# Patient Record
Sex: Male | Born: 1960
Health system: Southern US, Community
[De-identification: ages and names within clinical notes are randomized; demographics above are authoritative.]

## PROBLEM LIST (undated history)

## (undated) DIAGNOSIS — E669 Obesity, unspecified: Secondary | ICD-10-CM

## (undated) DIAGNOSIS — Z87442 Personal history of urinary calculi: Secondary | ICD-10-CM

## (undated) DIAGNOSIS — E785 Hyperlipidemia, unspecified: Secondary | ICD-10-CM

## (undated) DIAGNOSIS — Z8249 Family history of ischemic heart disease and other diseases of the circulatory system: Secondary | ICD-10-CM

## (undated) DIAGNOSIS — G4733 Obstructive sleep apnea (adult) (pediatric): Secondary | ICD-10-CM

## (undated) HISTORY — DX: Hyperlipidemia, unspecified: E78.5

## (undated) HISTORY — DX: Obesity, unspecified: E66.9

## (undated) HISTORY — DX: Family history of ischemic heart disease and other diseases of the circulatory system: Z82.49

## (undated) HISTORY — DX: Obstructive sleep apnea (adult) (pediatric): G47.33

---

## 2004-07-30 ENCOUNTER — Ambulatory Visit (HOSPITAL_COMMUNITY): Admission: RE | Admit: 2004-07-30 | Discharge: 2004-07-30 | Payer: Self-pay | Admitting: General Surgery

## 2005-06-16 HISTORY — PX: TRANSTHORACIC ECHOCARDIOGRAM: SHX275

## 2006-05-15 ENCOUNTER — Ambulatory Visit (HOSPITAL_COMMUNITY): Admission: RE | Admit: 2006-05-15 | Discharge: 2006-05-15 | Payer: Self-pay | Admitting: *Deleted

## 2006-05-21 ENCOUNTER — Ambulatory Visit (HOSPITAL_COMMUNITY): Admission: RE | Admit: 2006-05-21 | Discharge: 2006-05-21 | Payer: Self-pay | Admitting: Cardiology

## 2006-05-21 HISTORY — PX: CARDIAC CATHETERIZATION: SHX172

## 2006-06-06 ENCOUNTER — Ambulatory Visit (HOSPITAL_COMMUNITY): Admission: RE | Admit: 2006-06-06 | Discharge: 2006-06-06 | Payer: Self-pay | Admitting: Family Medicine

## 2010-06-16 HISTORY — PX: NM MYOCAR PERF WALL MOTION: HXRAD629

## 2013-12-19 ENCOUNTER — Encounter: Payer: Self-pay | Admitting: *Deleted

## 2013-12-28 ENCOUNTER — Ambulatory Visit (INDEPENDENT_AMBULATORY_CARE_PROVIDER_SITE_OTHER): Payer: 59 | Admitting: Internal Medicine

## 2013-12-28 ENCOUNTER — Encounter: Payer: Self-pay | Admitting: Internal Medicine

## 2013-12-28 VITALS — BP 146/82 | HR 59 | Ht 65.0 in | Wt 199.7 lb

## 2013-12-28 DIAGNOSIS — Z9989 Dependence on other enabling machines and devices: Secondary | ICD-10-CM

## 2013-12-28 DIAGNOSIS — R079 Chest pain, unspecified: Secondary | ICD-10-CM | POA: Insufficient documentation

## 2013-12-28 DIAGNOSIS — G4733 Obstructive sleep apnea (adult) (pediatric): Secondary | ICD-10-CM | POA: Insufficient documentation

## 2013-12-28 DIAGNOSIS — E785 Hyperlipidemia, unspecified: Secondary | ICD-10-CM

## 2013-12-28 NOTE — Patient Instructions (Signed)
Your physician has requested that you have an exercise tolerance test. For further information please visit [email or web address]. Please also follow instruction sheet, as given.  Your physician recommends that you return for lab work in a few days to week. (fasting)  Your physician recommends that you schedule a follow-up appointment in: 1 month.

## 2013-12-28 NOTE — Progress Notes (Signed)
OFFICE NOTE  Chief Complaint:  Chest pain  Primary Care Physician: No PCP Per Patient  HPI:  Jeffrey Holden a pleasant 53 year old male who I had previously seen in 2012 however he has been lost to followup. He presents today with symptoms of recurrent chest pain. His past medical history significant for a family history of coronary disease however he did not have any significant coronary disease. He had a stress test which was negative and a cardiac catheterization in 2007 which note showed no significant coronary disease. He also had a repeat stress test in 2012 which showed no ischemia and EF of 76%. He had good exercise capacity exercising for 11 minutes. He does have a history of sleep apnea on CPAP and is continuing to sleep well on that. He is maintaining too high of a weight and has a history of dyslipidemia but stopped taking his cholesterol medicine sometime ago and did not get his medications refilled. He is now reporting chest discomfort that comes on while he is at work. He feels a tightness in his chest especially when he gets upset or feels under stress. The symptoms improved with rest. He did not have symptoms during other activities such as exercise or physical activity.  PMHx:  Past Medical History  Diagnosis Date  . Family history of heart disease   . OSA (obstructive sleep apnea)   . Obesity   . Dyslipidemia     Past Surgical History  Procedure Laterality Date  . Transthoracic echocardiogram  2007    EF =>55%; mild MR; trace TR  . Nm myocar perf wall motion  2012    bruce myoview - normal perfusion in all regions, EF 76%  . Cardiac catheterization  05/21/2006    normal coronaries, normal LV function (Dr. Domenic Moras)    FAMHx:  Family History  Problem Relation Age of Onset  . Heart disease Mother   . Coronary artery disease Father     CABG at 43    SOCHx:   reports that he has never smoked. He has never used smokeless tobacco. He reports that he does  not drink alcohol or use illicit drugs.  ALLERGIES:  Allergies  Allergen Reactions  . Penicillins     ROS: A comprehensive review of systems was negative except for: Cardiovascular: positive for chest pain  HOME MEDS: Current Outpatient Prescriptions  Medication Sig Dispense Refill  . NON FORMULARY at bedtime. CPAP       No current facility-administered medications for this visit.    LABS/IMAGING: No results found for this or any previous visit (from the past 48 hour(s)). No results found.  VITALS: BP 146/82  Pulse 59  Ht 5\' 5"  (1.651 m)  Wt 199 lb 11.2 oz (90.583 kg)  BMI 33.23 kg/m2  EXAM: General appearance: alert and no distress Neck: no carotid bruit and no JVD Lungs: clear to auscultation bilaterally Heart: regular rate and rhythm, S1, S2 normal, no murmur, click, rub or gallop Abdomen: soft, non-tender; bowel sounds normal; no masses,  no organomegaly Extremities: extremities normal, atraumatic, no cyanosis or edema Pulses: 2+ and symmetric Skin: Skin color, texture, turgor normal. No rashes or lesions Neurologic: Grossly normal Psych: Mood, affect normal  EKG: Sinus bradycardia 59, no ischemia  ASSESSMENT: 1. Atypical chest pain 2. Dyslipidemia  PLAN: 1.   Mr. Sprong seems to be having chest pain which is related to stress and/or anxiety at work. He does not have symptoms with exercise or activity  outside of the office. He may also be due to spikes in his blood pressure. His blood pressure was high normal today and he's not on medication. We will need to see whether some blood pressure medication may be helpful for him. I would recommend a treadmill exercise stress test to reassess his functional capacity. He is interested in starting an exercise program and possibly losing weight. This would also help him with stress management. It his blood pressure is elevated or there is a significant hypertensive response to exercise, he may need a low dose of blood  pressure medication.  I will also go ahead and recheck a fasting lipid profile.  Pixie Casino, MD, [facility] Attending Cardiologist [facility]  Chelsa Stout C 12/28/2013, 2:09 PM

## 2014-01-05 ENCOUNTER — Encounter (HOSPITAL_COMMUNITY): Payer: 59

## 2014-01-06 ENCOUNTER — Encounter (HOSPITAL_COMMUNITY): Payer: 59

## 2014-01-10 ENCOUNTER — Telehealth (HOSPITAL_COMMUNITY): Payer: Self-pay

## 2014-01-10 NOTE — Telephone Encounter (Signed)
Encounter complete. 

## 2014-01-11 ENCOUNTER — Ambulatory Visit: Payer: Self-pay | Admitting: Internal Medicine

## 2014-01-12 ENCOUNTER — Ambulatory Visit (HOSPITAL_COMMUNITY)
Admission: RE | Admit: 2014-01-12 | Discharge: 2014-01-12 | Disposition: A | Discharge status: Home / Self Care | Payer: 59 | Source: Ambulatory Visit | Attending: Cardiology | Admitting: Cardiology

## 2014-01-12 DIAGNOSIS — R079 Chest pain, unspecified: Secondary | ICD-10-CM | POA: Diagnosis not present

## 2014-01-12 DIAGNOSIS — Z0189 Encounter for other specified special examinations: Secondary | ICD-10-CM | POA: Insufficient documentation

## 2014-01-12 LAB — LIPID PANEL
CHOL/HDL RATIO: 6.5 ratio
CHOLESTEROL: 202 mg/dL — AB (ref 0–200)
HDL: 31 mg/dL — AB (ref >39)
LDL Cholesterol: 142 mg/dL — ABNORMAL HIGH (ref 0–99)
Triglycerides: 147 mg/dL (ref <150)
VLDL: 29 mg/dL (ref 0–40)

## 2014-01-12 NOTE — Procedures (Signed)
Exercise Treadmill Test  Pre-Exercise Testing Evaluation   Test  Exercise Tolerance Test Ordering MD: Pixie Casino, MD  Interpreting MD:   Unique Test No: 1  Treadmill:  1  Indication for ETT: chest pain - rule out ischemia  Contraindication to ETT: Yes   Stress Modality: exercise - treadmill  Cardiac Imaging Performed: non   Protocol: standard Bruce - maximal  Max BP:  225/94  Max MPHR (bpm): 166 85% MPR (bpm):  141  MPHR obtained (bpm):  164 % MPHR obtained:  98  Reached 85% MPHR (min:sec): 9:42 Total Exercise Time (min-sec):  11:37  Workload in METS:  13.70 Borg Scale: Not recorded  Reason ETT Terminated:  fatigue    ST Segment Analysis At Rest: normal ST segments - no evidence of significant ST depression With Exercise: no evidence of significant ST depression  Other Information Arrhythmia:  No Angina during ETT:  absent (0) Quality of ETT:  diagnostic  ETT Interpretation:  normal - no evidence of ischemia by ST analysis  Comments: The patient had an good tolerance.  There was no chest pain.  There was an appropriate level of dyspnea.  There were no arrhythmias, a normal heart rate response and slightly increased BP response.  There were no ischemic ST T wave changes and a normal heart rate recovery.  The Duke score was 6 with a low risk category   Recommendations: Negative adequate ETT.  There was a bit of a hypertensive BP response with a reading of 218/108 at peak exercise.

## 2014-02-13 ENCOUNTER — Encounter: Payer: Self-pay | Admitting: Internal Medicine

## 2014-02-13 ENCOUNTER — Ambulatory Visit (INDEPENDENT_AMBULATORY_CARE_PROVIDER_SITE_OTHER): Payer: 59 | Admitting: Internal Medicine

## 2014-02-13 VITALS — BP 143/94 | HR 58 | Ht 65.0 in | Wt 202.5 lb

## 2014-02-13 DIAGNOSIS — I10 Essential (primary) hypertension: Secondary | ICD-10-CM | POA: Insufficient documentation

## 2014-02-13 DIAGNOSIS — R072 Precordial pain: Secondary | ICD-10-CM

## 2014-02-13 DIAGNOSIS — E785 Hyperlipidemia, unspecified: Secondary | ICD-10-CM

## 2014-02-13 DIAGNOSIS — Z9989 Dependence on other enabling machines and devices: Secondary | ICD-10-CM

## 2014-02-13 DIAGNOSIS — G4733 Obstructive sleep apnea (adult) (pediatric): Secondary | ICD-10-CM

## 2014-02-13 MED ORDER — VALSARTAN-HYDROCHLOROTHIAZIDE 80-12.5 MG PO TABS: 1.0000 | ORAL_TABLET | Freq: Every day | ORAL | Status: DC

## 2014-02-13 NOTE — Progress Notes (Signed)
OFFICE NOTE  Chief Complaint:  Chest pain  Primary Care Physician: No PCP Per Patient  HPI:  Jeffrey Holden a pleasant 53 year old male who I had previously seen in 2012 however he has been lost to followup. He presents today with symptoms of recurrent chest pain. His past medical history significant for a family history of coronary disease however he did not have any significant coronary disease. He had a stress test which was negative and a cardiac catheterization in 2007 which note showed no significant coronary disease. He also had a repeat stress test in 2012 which showed no ischemia and EF of 76%. He had good exercise capacity exercising for 11 minutes. He does have a history of sleep apnea on CPAP and is continuing to sleep well on that. He is maintaining too high of a weight and has a history of dyslipidemia but stopped taking his cholesterol medicine sometime ago and did not get his medications refilled. He is now reporting chest discomfort that comes on while he is at work. He feels a tightness in his chest especially when he gets upset or feels under stress. The symptoms improved with rest. He did not have symptoms during other activities such as exercise or physical activity.  Jeffrey Holden returns today for followup. In 1 exercise treadmill stress test for which she performed well. There was no evidence for ischemia. There was a hypertensive response to exercise. Blood pressure was well over 200/100. He's had no further chest pain symptoms.  PMHx:  Past Medical History  Diagnosis Date  . Family history of heart disease   . OSA (obstructive sleep apnea)   . Obesity   . Dyslipidemia     Past Surgical History  Procedure Laterality Date  . Transthoracic echocardiogram  2007    EF =>55%; mild MR; trace TR  . Nm myocar perf wall motion  2012    bruce myoview - normal perfusion in all regions, EF 76%  . Cardiac catheterization  05/21/2006    normal coronaries, normal LV  function (Dr. Domenic Moras)    FAMHx:  Family History  Problem Relation Age of Onset  . Heart disease Mother   . Coronary artery disease Father     CABG at 6    SOCHx:   reports that he has never smoked. He has never used smokeless tobacco. He reports that he does not drink alcohol or use illicit drugs.  ALLERGIES:  Allergies  Allergen Reactions  . Penicillins     ROS: A comprehensive review of systems was negative.  HOME MEDS: Current Outpatient Prescriptions  Medication Sig Dispense Refill  . NON FORMULARY at bedtime. CPAP      . valsartan-hydrochlorothiazide (DIOVAN-HCT) 80-12.5 MG per tablet Take 1 tablet by mouth daily.  30 tablet  6   No current facility-administered medications for this visit.    LABS/IMAGING: No results found for this or any previous visit (from the past 48 hour(s)). No results found.  VITALS: BP 143/94  Pulse 58  Ht 5\' 5"  (1.651 m)  Wt 202 lb 8 oz (91.853 kg)  BMI 33.70 kg/m2  EXAM: General appearance: alert and no distress Neck: no carotid bruit and no JVD Lungs: clear to auscultation bilaterally Heart: regular rate and rhythm, S1, S2 normal, no murmur, click, rub or gallop Abdomen: soft, non-tender; bowel sounds normal; no masses,  no organomegaly Extremities: extremities normal, atraumatic, no cyanosis or edema Pulses: 2+ and symmetric Skin: Skin color, texture, turgor normal. No  rashes or lesions Neurologic: Grossly normal Psych: Mood, affect normal  EKG: deferred  ASSESSMENT: 1. Atypical chest pain - negative exercise treadmill stress test 2. Dyslipidemia 3. Hypertension  PLAN: 1.   Jeffrey Holden had a negative exercise treadmill stress test. I think this is a good start for his exercise program. Encouraged increased aerobic exercise of at least 30-60 minutes 3-5 times a week. This should help with weight loss and improve his cholesterol profile. His cholesterol is abnormal indicating a total cholesterol of 202,  triglycerides 147, HDL 31 and LDL 142. I think he can make some strides with diet and exercise. I've given them materials today about a Mediterranean diet and ways to decrease cholesterol. Finally, he has had continued borderline elevated blood pressures. His hypertensive response to exercise indicates a proclivity to hypertension. I would therefore recommend starting medication to lower his blood pressure. I would recommend a combination ARB diuretic, specifically Diovan HCTZ 80/12.5 mg daily. He will followup with Jeffrey Holden, our hypertension specialist in one to 2 weeks for a blood pressure check to see if he is at goal. I recommended him purchasing a blood pressure cuff and keep a record of his blood pressures at home to bring with him. Plan to see him back in 6 months.  Pixie Casino, MD, [facility] Attending Cardiologist [facility]  Jeffrey Holden 02/13/2014, 10:24 AM

## 2014-02-13 NOTE — Patient Instructions (Signed)
Your physician has recommended you make the following change in your medication: START diovan-hct 80-12.5mg  once daily for blood pressure.   Please schedule a BP check appointment in 2 weeks with [place] physician wants you to follow-up in: 6 months. You will receive a reminder letter in the mail two months in advance. If you don't receive a letter, please call our office to schedule the follow-up appointment.

## 2014-03-01 ENCOUNTER — Ambulatory Visit: Payer: 59 | Admitting: Pharmacist Clinician (PhC)/ Clinical Pharmacy Specialist

## 2014-03-15 ENCOUNTER — Ambulatory Visit: Payer: 59 | Admitting: Pharmacist Clinician (PhC)/ Clinical Pharmacy Specialist

## 2014-03-17 ENCOUNTER — Ambulatory Visit (INDEPENDENT_AMBULATORY_CARE_PROVIDER_SITE_OTHER): Payer: 59 | Admitting: Pharmacist Clinician (PhC)/ Clinical Pharmacy Specialist

## 2014-03-17 VITALS — BP 110/76 | HR 64 | Ht 65.0 in | Wt 200.9 lb

## 2014-03-17 DIAGNOSIS — I10 Essential (primary) hypertension: Secondary | ICD-10-CM

## 2014-03-17 LAB — BASIC METABOLIC PANEL
BUN: 27 mg/dL — AB (ref 6–23)
CALCIUM: 9.5 mg/dL (ref 8.4–10.5)
CO2: 23 meq/L (ref 19–32)
Chloride: 104 mEq/L (ref 96–112)
Creat: 1 mg/dL (ref 0.50–1.35)
GLUCOSE: 119 mg/dL — AB (ref 70–99)
POTASSIUM: 4.3 meq/L (ref 3.5–5.3)
Sodium: 138 mEq/L (ref 135–145)

## 2014-03-17 NOTE — Patient Instructions (Signed)
Return for a a follow up appointment with Dr. Debara Pickett in 6 months  Your blood pressure today is 110/76  Take your BP meds as follows: - continue with the valsartan/hct 80/12.5  Bring all of your meds, your BP cuff and your record of home blood pressures to your next appointment.  Exercise as you're able, try to walk approximately 30 minutes per day.  Keep salt intake to a minimum, especially watch canned and prepared boxed foods.  Eat more fresh fruits and vegetables and fewer canned items.  Avoid eating in fast food restaurants.   Should you decide to buy a blood pressure cuff, OMRON is a good brand.  Bring it by the office for calibration within 1-2 weeks of purchase   HOW TO TAKE YOUR BLOOD PRESSURE:   Rest 5 minutes before taking your blood pressure.    Don't smoke or drink caffeinated beverages for at least 30 minutes before.   Take your blood pressure before (not after) you eat.   Sit comfortably with your back supported and both feet on the floor (don't cross your legs).   Elevate your arm to heart level on a table or a desk.   Use the proper sized cuff. It should fit smoothly and snugly around your bare upper arm. There should be enough room to slip a fingertip under the cuff. The bottom edge of the cuff should be 1 inch above the crease of the elbow.   Ideally, take 3 measurements at one sitting and record the average.

## 2014-03-19 ENCOUNTER — Encounter: Payer: Self-pay | Admitting: Pharmacist Clinician (PhC)/ Clinical Pharmacy Specialist

## 2014-03-19 NOTE — Progress Notes (Signed)
     03/19/2014 Jeffrey Holden [birth year] [id number]   HPI:  Jeffrey Holden is a 53 y.o. male patient of Dr Debara Pickett, with a PMH below who presents today for hypertension clinic evaluation.  Jeffrey Holden has a significant family history in that his father had CABG x 2 at the age of 28.   He recently saw Dr. Debara Pickett for chest discomfort, an exercise stress test was negative, although it did show a hypertensive response to 218/108.  Dr Debara Pickett started him on valsartan hctz 80/12.5 and he is in the office today for a follow up.    Socially, he does not drink alcohol or use tobacco products.  He does not do any regular exercise and works a Network engineer job, getting very little daily movement.  Caffeine consumption is limited to a morning cup of coffee.  He admits to eating out about 1/2 of his meals, but those he eats at home do not involve added salt.   He does suffer from sleep apnea and uses CPAP regularly.  He also states compliance with taking his valsartan/hctz, his only medication.   He previously took simvastatin 20mg  for his cholesterol, but states he quit taking that awhile back.    Current Outpatient Prescriptions  Medication Sig Dispense Refill  . NON FORMULARY at bedtime. CPAP      . valsartan-hydrochlorothiazide (DIOVAN-HCT) 80-12.5 MG per tablet Take 1 tablet by mouth daily.  30 tablet  6   No current facility-administered medications for this visit.    Allergies  Allergen Reactions  . Penicillins     Past Medical History  Diagnosis Date  . Family history of heart disease   . OSA (obstructive sleep apnea)   . Obesity   . Dyslipidemia     Blood pressure 110/76, pulse 64, height 5\' 5"  (1.651 m), weight 200 lb 14.4 oz (91.128 kg).   ASSESSMENT AND PLAN:  Today Jeffrey Holden blood pressure appears well controlled.  I have encouraged him to start exercising, starting with short walks during his lunch break or after work.  We talked about the benefits of decreased sodium (and  looking for restaurant choices that low sodium) and increased exercise in maintaining good cardiac health and blood pressure.   I suggested that he did not need to purchase a blood pressure cuff at this time, but that he should check it on occasion when in a drugstore.  I will have him go to the lab for a BMET, to be sure his kidney function and potassium levels are WNL, otherwise I will see him back in the future should he or another provider notice a rise in his BP.  Jeffrey Holden PharmD CPP [facility]

## 2014-12-05 ENCOUNTER — Other Ambulatory Visit: Payer: Self-pay | Admitting: Internal Medicine

## 2014-12-05 NOTE — Telephone Encounter (Signed)
Rx has been sent to the pharmacy electronically. ° °

## 2015-01-04 ENCOUNTER — Other Ambulatory Visit: Payer: Self-pay | Admitting: Internal Medicine

## 2015-01-04 NOTE — Telephone Encounter (Signed)
Rx(s) sent to pharmacy electronically.  

## 2015-01-12 ENCOUNTER — Other Ambulatory Visit: Payer: Self-pay | Admitting: Internal Medicine

## 2015-01-12 ENCOUNTER — Other Ambulatory Visit: Payer: Self-pay | Admitting: *Deleted

## 2015-01-12 NOTE — Telephone Encounter (Signed)
REFILL 

## 2015-02-15 ENCOUNTER — Encounter: Payer: Self-pay | Admitting: Physician Assistant

## 2015-02-15 ENCOUNTER — Other Ambulatory Visit: Payer: Self-pay

## 2015-02-15 ENCOUNTER — Ambulatory Visit (INDEPENDENT_AMBULATORY_CARE_PROVIDER_SITE_OTHER): Payer: 59 | Admitting: Physician Assistant

## 2015-02-15 VITALS — BP 124/90 | HR 56 | Ht 65.0 in | Wt 201.4 lb

## 2015-02-15 DIAGNOSIS — E785 Hyperlipidemia, unspecified: Secondary | ICD-10-CM | POA: Diagnosis not present

## 2015-02-15 DIAGNOSIS — G4733 Obstructive sleep apnea (adult) (pediatric): Secondary | ICD-10-CM

## 2015-02-15 DIAGNOSIS — I10 Essential (primary) hypertension: Secondary | ICD-10-CM | POA: Diagnosis not present

## 2015-02-15 DIAGNOSIS — Z9989 Dependence on other enabling machines and devices: Principal | ICD-10-CM

## 2015-02-15 MED ORDER — VALSARTAN-HYDROCHLOROTHIAZIDE 80-12.5 MG PO TABS: 1.0000 | ORAL_TABLET | Freq: Every day | ORAL | Status: DC

## 2015-02-15 NOTE — Patient Instructions (Signed)
Your physician wants you to follow-up in: 1 year with Dr. Hilty. You will receive a reminder letter in the mail two months in advance. If you don't receive a letter, please call our office to schedule the follow-up appointment.  

## 2015-02-15 NOTE — Progress Notes (Signed)
Patient ID: Jeffrey Holden, male   DOB: 1960/10/23, 54 y.o.   MRN: [id number]     Date:  02/15/2015   ID:  Jeffrey Holden, DOB 10-Jan-1961, MRN [id number]  PCP:  No PCP Per Patient  Primary Cardiologist:  Hilty   Chief Complaint  Patient presents with  . Follow-up    need refills     History of Present Illness: [name] is a 54 y.o. male past medical history significant for a family history of coronary disease however, he did not have any significant coronary disease. He had a stress test which was negative and a cardiac catheterization in 2007 which note showed no significant coronary disease. He also had a repeat stress test in 2012 which showed no ischemia and EF of 76%. He had good exercise capacity exercising for 11 minutes. In 2015 he had another exercise tolerance test which was negative for ischemia. He does have a history of sleep apnea on CPAP and is continuing to sleep well on that. He is maintaining too high of a weight and has a history of dyslipidemia but stopped taking his cholesterol medicine sometime ago and did not get his medications refilled.  She presents today for medication refill and follow-up.  He has no complaints.  He reports BP seems well controlled every time it is checked. He does report some periodic blood in his stool which he attributes to hemorrhoids.  The patient currently denies nausea, vomiting, fever, chest pain, shortness of breath, orthopnea, dizziness, PND, cough, congestion, abdominal pain, melena, lower extremity edema, claudication.  Wt Readings from Last 3 Encounters:  02/15/15 201 lb 6.4 oz (91.354 kg)  03/19/14 200 lb 14.4 oz (91.128 kg)  02/13/14 202 lb 8 oz (91.853 kg)     Past Medical History  Diagnosis Date  . Family history of heart disease   . OSA (obstructive sleep apnea)   . Obesity   . Dyslipidemia     Current Outpatient Prescriptions  Medication Sig Dispense Refill  . NON FORMULARY at bedtime. CPAP    .  valsartan-hydrochlorothiazide (DIOVAN-HCT) 80-12.5 MG per tablet Take 1 tablet by mouth daily. NEED OV. 30 tablet 0   No current facility-administered medications for this visit.    Allergies:    Allergies  Allergen Reactions  . Penicillins     Social History:  The patient  reports that he has never smoked. He has never used smokeless tobacco. He reports that he does not drink alcohol or use illicit drugs.   Family history:   Family History  Problem Relation Age of Onset  . Heart disease Mother   . Coronary artery disease Father     CABG at 51    ROS:  Please see the history of present illness.  All other systems reviewed and negative.   PHYSICAL EXAM: VS:  BP 124/90 mmHg  Pulse 56  Ht 5\' 5"  (1.651 m)  Wt 201 lb 6.4 oz (91.354 kg)  BMI 33.51 kg/m2 Well nourished, well developed, in no acute distress HEENT: Pupils are equal round react to light accommodation extraocular movements are intact.  Neck: no JVDNo cervical lymphadenopathy. Cardiac: Regular rate and rhythm without murmurs rubs or gallops. Lungs:  clear to auscultation bilaterally, no wheezing, rhonchi or rales Ext: no lower extremity edema.  2+ radial and dorsalis pedis pulses. Skin: warm and dry Neuro:  Grossly normal  EKG:  His bradycardia rate 56 bpm  ASSESSMENT AND PLAN:  Problem List Items Addressed This Visit  OSA on CPAP - Primary   Relevant Orders   EKG 12-Lead   Lipid panel   Dyslipidemia   Relevant Orders   EKG 12-Lead   Lipid panel   Benign essential HTN     Objective sleep apnea on CPAP  Essential hypertension  Blood pressure well-controlled. Continue Diovan HCT. Dyslipidemia Previous lipid panel: total cholesterol of 202, triglycerides 147, HDL 31 and LDL 142.  We'll have him recheck this in the next week or 2.   Follow-up in one year

## 2015-02-17 ENCOUNTER — Other Ambulatory Visit: Payer: Self-pay | Admitting: Internal Medicine

## 2015-03-27 LAB — LIPID PANEL
CHOL/HDL RATIO: 7 ratio — AB (ref <=5.0)
Cholesterol: 204 mg/dL — ABNORMAL HIGH (ref 125–200)
HDL: 29 mg/dL — ABNORMAL LOW (ref >=40)
LDL Cholesterol: 140 mg/dL — ABNORMAL HIGH (ref <130)
Triglycerides: 174 mg/dL — ABNORMAL HIGH (ref <150)
VLDL: 35 mg/dL — ABNORMAL HIGH (ref <30)

## 2015-04-03 ENCOUNTER — Other Ambulatory Visit: Payer: Self-pay

## 2015-04-03 DIAGNOSIS — E785 Hyperlipidemia, unspecified: Secondary | ICD-10-CM

## 2015-04-03 DIAGNOSIS — I10 Essential (primary) hypertension: Secondary | ICD-10-CM

## 2015-04-03 MED ORDER — ATORVASTATIN CALCIUM 20 MG PO TABS: 20.0000 mg | ORAL_TABLET | Freq: Every day | ORAL | Status: DC

## 2016-03-31 ENCOUNTER — Other Ambulatory Visit: Payer: Self-pay

## 2016-03-31 MED ORDER — ATORVASTATIN CALCIUM 20 MG PO TABS: 20.0000 mg | ORAL_TABLET | Freq: Every day | ORAL | 6 refills | Status: DC

## 2016-04-08 ENCOUNTER — Other Ambulatory Visit (HOSPITAL_COMMUNITY): Payer: Self-pay | Admitting: Registered Nurse

## 2016-04-08 DIAGNOSIS — R1902 Left upper quadrant abdominal swelling, mass and lump: Secondary | ICD-10-CM

## 2016-04-14 ENCOUNTER — Ambulatory Visit (HOSPITAL_COMMUNITY)
Admission: RE | Admit: 2016-04-14 | Discharge: 2016-04-14 | Disposition: A | Discharge status: Home / Self Care | Payer: 59 | Source: Ambulatory Visit | Attending: Registered Nurse | Admitting: Registered Nurse

## 2016-04-14 DIAGNOSIS — K760 Fatty (change of) liver, not elsewhere classified: Secondary | ICD-10-CM | POA: Diagnosis not present

## 2016-04-14 DIAGNOSIS — R911 Solitary pulmonary nodule: Secondary | ICD-10-CM | POA: Insufficient documentation

## 2016-04-14 DIAGNOSIS — R1902 Left upper quadrant abdominal swelling, mass and lump: Secondary | ICD-10-CM

## 2016-04-14 MED ORDER — IOPAMIDOL (ISOVUE-300) INJECTION 61%
100.0000 mL | Freq: Once | INTRAVENOUS | Status: AC | PRN
  Administered 2016-04-14: 100 mL via INTRAVENOUS

## 2016-04-16 ENCOUNTER — Other Ambulatory Visit (HOSPITAL_COMMUNITY): Payer: Self-pay | Admitting: Family Medicine

## 2016-04-16 DIAGNOSIS — R918 Other nonspecific abnormal finding of lung field: Secondary | ICD-10-CM

## 2016-04-25 ENCOUNTER — Ambulatory Visit (HOSPITAL_COMMUNITY)
Admission: RE | Admit: 2016-04-25 | Discharge: 2016-04-25 | Disposition: A | Discharge status: Home / Self Care | Payer: 59 | Source: Ambulatory Visit | Attending: Family Medicine | Admitting: Family Medicine

## 2016-04-25 DIAGNOSIS — R918 Other nonspecific abnormal finding of lung field: Secondary | ICD-10-CM | POA: Insufficient documentation

## 2016-04-25 DIAGNOSIS — J984 Other disorders of lung: Secondary | ICD-10-CM | POA: Diagnosis present

## 2016-07-07 DIAGNOSIS — J018 Other acute sinusitis: Secondary | ICD-10-CM | POA: Diagnosis not present

## 2016-07-20 ENCOUNTER — Emergency Department (HOSPITAL_COMMUNITY)
Admission: EM | Admit: 2016-07-20 | Discharge: 2016-07-20 | Disposition: A | Discharge status: Home / Self Care | Payer: 59 | Attending: Emergency Medicine | Admitting: Emergency Medicine

## 2016-07-20 ENCOUNTER — Encounter (HOSPITAL_COMMUNITY): Payer: Self-pay | Admitting: Emergency Medicine

## 2016-07-20 ENCOUNTER — Emergency Department (HOSPITAL_COMMUNITY): Payer: 59

## 2016-07-20 DIAGNOSIS — Z79899 Other long term (current) drug therapy: Secondary | ICD-10-CM | POA: Insufficient documentation

## 2016-07-20 DIAGNOSIS — L03115 Cellulitis of right lower limb: Secondary | ICD-10-CM | POA: Diagnosis not present

## 2016-07-20 DIAGNOSIS — R600 Localized edema: Secondary | ICD-10-CM | POA: Diagnosis not present

## 2016-07-20 DIAGNOSIS — M79604 Pain in right leg: Secondary | ICD-10-CM | POA: Diagnosis not present

## 2016-07-20 MED ORDER — HYDROCODONE-ACETAMINOPHEN 5-325 MG PO TABS: 2.0000 | ORAL_TABLET | ORAL | 0 refills | Status: DC | PRN

## 2016-07-20 MED ORDER — CEPHALEXIN 500 MG PO CAPS: 500.0000 mg | ORAL_CAPSULE | Freq: Four times a day (QID) | ORAL | 0 refills | Status: DC

## 2016-07-20 MED ORDER — SULFAMETHOXAZOLE-TRIMETHOPRIM 800-160 MG PO TABS: 1.0000 | ORAL_TABLET | Freq: Two times a day (BID) | ORAL | 0 refills | Status: AC

## 2016-07-20 NOTE — ED Triage Notes (Signed)
Patient c/o right inner thigh pain that started yesterday. Per patient "knot" in inner thigh with burning sensation with walking. Denies any injury. Per patient recently on 3 hour flight and pain started upon going back to work. Per patient no change in temp of right lower extremity. Denies any swelling in lower extremity other than site of "knot."

## 2016-07-20 NOTE — ED Notes (Signed)
To radiology via stretcher.

## 2016-07-20 NOTE — ED Notes (Signed)
From radiology 

## 2016-07-20 NOTE — Discharge Instructions (Signed)
Return if any problems.

## 2016-07-20 NOTE — ED Provider Notes (Signed)
[facility] DEPT Provider Note   CSN: [id number] Arrival date & time: 07/20/16  0900     History   Chief Complaint Chief Complaint  Patient presents with  . Leg Pain    HPI Jeffrey Holden is a 56 y.o. male.  The history is provided by the patient. No language interpreter was used.  Leg Pain   This is a new problem. The current episode started yesterday. The pain is present in the right upper leg. The quality of the pain is described as aching. The pain is at a severity of 5/10. The pain is moderate. The symptoms are aggravated by standing. He has tried nothing for the symptoms. The treatment provided no relief. There has been no history of extremity trauma.   Pt complains of redness and swelling to right leg.  Pt reports he feels a knot in his leg. Past Medical History:  Diagnosis Date  . Dyslipidemia   . Family history of heart disease   . Obesity   . OSA (obstructive sleep apnea)     Patient Active Problem List   Diagnosis Date Noted  . Benign essential HTN 02/13/2014  . OSA on CPAP 12/28/2013  . Dyslipidemia 12/28/2013  . Chest pain 12/28/2013    Past Surgical History:  Procedure Laterality Date  . CARDIAC CATHETERIZATION  05/21/2006   normal coronaries, normal LV function (Dr. Domenic Moras)  . NM MYOCAR PERF WALL MOTION  2012   bruce myoview - normal perfusion in all regions, EF 76%  . TRANSTHORACIC ECHOCARDIOGRAM  2007   EF =>55%; mild MR; trace TR       Home Medications    Prior to Admission medications   Medication Sig Start Date End Date Taking? Authorizing Provider  valsartan-hydrochlorothiazide (DIOVAN-HCT) 80-12.5 MG per tablet Take 1 tablet by mouth daily. 02/15/15  Yes Brett Canales, PA-C    Family History Family History  Problem Relation Age of Onset  . Heart disease Mother   . Coronary artery disease Father     CABG at 9    Social History Social History  Substance Use Topics  . Smoking status: Never Smoker  . Smokeless tobacco:  Never Used  . Alcohol use No     Allergies   Penicillins   Review of Systems Review of Systems   Physical Exam Updated Vital Signs BP 137/91 (BP Location: Left Arm)   Pulse 60   Temp 98.4 F (36.9 C) (Oral)   Resp 16   Ht 5\' 5"  (1.651 m)   Wt 90.7 kg   SpO2 98%   BMI 33.28 kg/m   Physical Exam  Constitutional: He appears well-developed and well-nourished.  HENT:  Head: Normocephalic and atraumatic.  Eyes: Conjunctivae are normal.  Neck: Neck supple.  Cardiovascular: Normal rate and regular rhythm.   No murmur heard. Pulmonary/Chest: Effort normal and breath sounds normal. No respiratory distress.  Abdominal: Soft. There is no tenderness.  Musculoskeletal: He exhibits no edema.  Tender right medial thigh, 15x10 cm area of erythema, firm area center   Neurological: He is alert.  Skin: Skin is warm and dry.  Psychiatric: He has a normal mood and affect.  Nursing note and vitals reviewed.    ED Treatments / Results  Labs (all labs ordered are listed, but only abnormal results are displayed) Labs Reviewed - No data to display  EKG  EKG Interpretation None       Radiology US Venous Img Lower Unilateral Right  Result Date: 07/20/2016  CLINICAL DATA:  Right lower extremity pain and edema for the past day. Palpable area involving the inner aspect the right thigh. Evaluate for DVT. EXAM: RIGHT LOWER EXTREMITY VENOUS DOPPLER ULTRASOUND TECHNIQUE: Gray-scale sonography with graded compression, as well as color Doppler and duplex ultrasound were performed to evaluate the lower extremity deep venous systems from the level of the common femoral vein and including the common femoral, femoral, profunda femoral, popliteal and calf veins including the posterior tibial, peroneal and gastrocnemius veins when visible. The superficial great saphenous vein was also interrogated. Spectral Doppler was utilized to evaluate flow at rest and with distal augmentation maneuvers in the  common femoral, femoral and popliteal veins. COMPARISON:  None. FINDINGS: Contralateral Common Femoral Vein: Respiratory phasicity is normal and symmetric with the symptomatic side. No evidence of thrombus. Normal compressibility. Common Femoral Vein: No evidence of thrombus. Normal compressibility, respiratory phasicity and response to augmentation. Saphenofemoral Junction: No evidence of thrombus. Normal compressibility and flow on color Doppler imaging. Profunda Femoral Vein: No evidence of thrombus. Normal compressibility and flow on color Doppler imaging. Femoral Vein: No evidence of thrombus. Normal compressibility, respiratory phasicity and response to augmentation. Popliteal Vein: No evidence of thrombus. Normal compressibility, respiratory phasicity and response to augmentation. Calf Veins: No evidence of thrombus. Normal compressibility and flow on color Doppler imaging. Superficial Great Saphenous Vein: No evidence of thrombus. Normal compressibility and flow on color Doppler imaging. Venous Reflux:  None. Other Findings: Provided grayscale and color images of the patient's palpable area of concern involving the medial aspect the right thigh are negative for discrete solid or cystic lesions (representative images 23 through 27). IMPRESSION: 1. No evidence of DVT within the right lower extremity. 2. No sonographic correlate for patient's palpable area of concern involving the inner aspect of the right thigh. Electronically Signed   By: Sandi Mariscal M.D.   On: 07/20/2016 11:53    Procedures Procedures (including critical care time)  Medications Ordered in ED Medications - No data to display   Initial Impression / Assessment and Plan / ED Course  I have reviewed the triage vital signs and the nursing notes.  Pertinent labs & imaging results that were available during my care of the patient were reviewed by me and considered in my medical decision making (see chart for details).    I will treat  pt with keflex and bactrim.  Pt given rx for hydrocodone for pain   Final Clinical Impressions(s) / ED Diagnoses   Final diagnoses:  Cellulitis of right lower extremity    New Prescriptions Discharge Medication List as of 07/20/2016 12:37 PM    START taking these medications   Details  cephALEXin (KEFLEX) 500 MG capsule Take 1 capsule (500 mg total) by mouth 4 (four) times daily., Starting Sun 07/20/2016, Print    HYDROcodone-acetaminophen (NORCO/VICODIN) 5-325 MG tablet Take 2 tablets by mouth every 4 (four) hours as needed., Starting Sun 07/20/2016, Print    sulfamethoxazole-trimethoprim (BACTRIM DS,SEPTRA DS) 800-160 MG tablet Take 1 tablet by mouth 2 (two) times daily., Starting Sun 07/20/2016, Until Sun 07/27/2016, Print      An After Visit Summary was printed and given to the patient.   Hollace Kinnier Dunn Loring, PA-C 07/20/16 [place], MD 07/22/16 [phone]

## 2016-11-28 DIAGNOSIS — I10 Essential (primary) hypertension: Secondary | ICD-10-CM | POA: Diagnosis not present

## 2016-11-28 DIAGNOSIS — Z1389 Encounter for screening for other disorder: Secondary | ICD-10-CM | POA: Diagnosis not present

## 2016-11-28 DIAGNOSIS — E782 Mixed hyperlipidemia: Secondary | ICD-10-CM | POA: Diagnosis not present

## 2016-11-28 DIAGNOSIS — R7309 Other abnormal glucose: Secondary | ICD-10-CM | POA: Diagnosis not present

## 2016-11-28 DIAGNOSIS — J984 Other disorders of lung: Secondary | ICD-10-CM | POA: Diagnosis not present

## 2016-12-02 ENCOUNTER — Other Ambulatory Visit (HOSPITAL_COMMUNITY): Payer: Self-pay | Admitting: Family Medicine

## 2016-12-02 DIAGNOSIS — J984 Other disorders of lung: Secondary | ICD-10-CM

## 2016-12-11 ENCOUNTER — Ambulatory Visit (HOSPITAL_COMMUNITY)
Admission: RE | Admit: 2016-12-11 | Discharge: 2016-12-11 | Disposition: A | Discharge status: Home / Self Care | Payer: 59 | Source: Ambulatory Visit | Attending: Family Medicine | Admitting: Family Medicine

## 2016-12-11 DIAGNOSIS — I700 Atherosclerosis of aorta: Secondary | ICD-10-CM | POA: Insufficient documentation

## 2016-12-11 DIAGNOSIS — R918 Other nonspecific abnormal finding of lung field: Secondary | ICD-10-CM | POA: Insufficient documentation

## 2016-12-11 DIAGNOSIS — J984 Other disorders of lung: Secondary | ICD-10-CM | POA: Diagnosis not present

## 2016-12-28 ENCOUNTER — Other Ambulatory Visit: Payer: Self-pay | Admitting: Internal Medicine

## 2017-07-27 DIAGNOSIS — K7689 Other specified diseases of liver: Secondary | ICD-10-CM | POA: Diagnosis not present

## 2017-08-12 DIAGNOSIS — R7309 Other abnormal glucose: Secondary | ICD-10-CM | POA: Diagnosis not present

## 2017-08-12 DIAGNOSIS — Z1389 Encounter for screening for other disorder: Secondary | ICD-10-CM | POA: Diagnosis not present

## 2017-08-12 DIAGNOSIS — E782 Mixed hyperlipidemia: Secondary | ICD-10-CM | POA: Diagnosis not present

## 2017-08-12 DIAGNOSIS — Z23 Encounter for immunization: Secondary | ICD-10-CM | POA: Diagnosis not present

## 2017-08-25 ENCOUNTER — Encounter: Payer: Self-pay | Admitting: Gastroenterology

## 2017-09-14 ENCOUNTER — Ambulatory Visit (INDEPENDENT_AMBULATORY_CARE_PROVIDER_SITE_OTHER): Payer: 59

## 2017-09-14 DIAGNOSIS — Z1211 Encounter for screening for malignant neoplasm of colon: Secondary | ICD-10-CM

## 2017-09-14 MED ORDER — NA SULFATE-K SULFATE-MG SULF 17.5-3.13-1.6 GM/177ML PO SOLN: 1.0000 | ORAL | 0 refills | Status: DC

## 2017-09-14 NOTE — Progress Notes (Addendum)
Gastroenterology Pre-Procedure Review  Request Date:09/14/17 Requesting Physician: Dr.Golding ( pt stated he had a tcs 10years ago at [facility]- I could not find any record of this)   PATIENT REVIEW QUESTIONS: The patient responded to the following health history questions as indicated:    1. Diabetes Melitis: no 2. Joint replacements in the past 12 months: no 3. Major health problems in the past 3 months: no 4. Has an artificial valve or MVP: no 5. Has a defibrillator: no 6. Has been advised in past to take antibiotics in advance of a procedure like teeth cleaning: no 7. Family history of colon cancer: no  8. Alcohol Use: no 9. History of sleep apnea: yes (on cpap)  10. History of coronary artery or other vascular stents placed within the last 12 months: no 11. History of any prior anesthesia complications: no    MEDICATIONS & ALLERGIES:    Patient reports the following regarding taking any blood thinners:   Plavix? no Aspirin? no Coumadin? no Brilinta? no Xarelto? no Eliquis? no Pradaxa? no Savaysa? no Effient? no  Patient confirms/reports the following medications:  Current Outpatient Medications  Medication Sig Dispense Refill  . atorvastatin (LIPITOR) 20 MG tablet TAKE 1 TABLET BY MOUTH EVERY DAY 30 tablet 6  . valsartan-hydrochlorothiazide (DIOVAN-HCT) 80-12.5 MG per tablet Take 1 tablet by mouth daily. 90 tablet 3   No current facility-administered medications for this visit.     Patient confirms/reports the following allergies:  Allergies  Allergen Reactions  . Penicillins     No orders of the defined types were placed in this encounter.   AUTHORIZATION INFORMATION Primary Insurance: [employer] [facility],  [place] #: [id number] Pre-Cert / Josem Kaufmann required: yes Pre-Cert / Auth #: [id number]   SCHEDULE INFORMATION: Procedure has been scheduled as follows:  Date: 11/06/17, Time: 12:15 Location: [facility] Dr[facility]  This Gastroenterology Pre-Precedure Review Form is being routed to the  following provider(s): Roseanne Kaufman NP

## 2017-09-14 NOTE — Patient Instructions (Signed)
Jeffrey Holden  Apr 07, 1961 MRN: [id number]     Procedure Date: 11/06/17 Time to register: 11:15 Place to register: [facility] Procedure Time: 12:15 Scheduled provider: Barney Drain, MD    PREPARATION FOR COLONOSCOPY WITH SUPREP BOWEL PREP KIT  Note: Suprep Bowel Prep Kit is a split-dose (2day) regimen. Consumption of BOTH 6-ounce bottles is required for a complete prep.  Please notify us immediately if you are diabetic, take iron supplements, or if you are on Coumadin or any other blood thinners.                                                                                                                                                     1 DAY BEFORE PROCEDURE:  DATE: 11/05/17   DAY: Thursday   clear liquids the entire day - NO SOLID FOOD.     At 6:00pm: Complete steps 1 through 4 below, using ONE (1) 6-ounce bottle, before going to bed. Step 1:  Pour ONE (1) 6-ounce bottle of SUPREP liquid into the mixing container.  Step 2:  Add cool drinking water to the 16 ounce line on the container and mix.  Note: Dilute the solution concentrate as directed prior to use. Step 3:  DRINK ALL the liquid in the container. Step 4:  You MUST drink an additional two (2) or more 16 ounce containers of water  over the next one (1) hour.   Continue clear liquids only, until midnight. Do not eat or drink anything after midnight. EXCEPTION: If you take medications for your heart, blood pressure, or breathing, you may take these medications with a small amount of clear liquid.   DAY OF PROCEDURE:   DATE: 11/06/17   DAY: Friday   5 hours before your procedure at 7:15: Step 1:  Pour ONE (1) 6-ounce bottle of SUPREP liquid into the mixing container.  Step 2:  Add cool drinking water to the 16 ounce line on the container and mix.  Note: Dilute the solution concentrate as directed prior to use. Step 3:  DRINK ALL the liquid in the container. Step 4:  You MUST drink an additional two  (2) or more 16 ounce containers of water over the next one (1) hour. You MUST complete the final glass of water at least 3 hours before your colonoscopy.   Nothing by mouth past 9:15am  You may take your morning medications with sip of water unless we have instructed otherwise.    Please see below for Dietary Information.  CLEAR LIQUIDS INCLUDE:  Water Jello (NOT red in color)   Ice Popsicles (NOT red in color)   Tea (sugar ok, no milk/cream) Powdered fruit flavored drinks  Coffee (sugar ok, no milk/cream) Gatorade/ Lemonade/ Kool-Aid  (NOT red in color)   Juice: apple, white grape, white cranberry Soft drinks  Clear bullion, consomme, broth (fat free beef/chicken/vegetable)  Carbonated beverages (any kind)  Strained chicken noodle soup Hard Candy   Remember: Clear liquids are liquids that will allow you to see your fingers on the other side of a clear glass. Be sure liquids are NOT red in color, and not cloudy, but CLEAR.  DO NOT EAT OR DRINK ANY OF THE FOLLOWING:  Dairy products of any kind   Cranberry juice Tomato juice / V8 juice   Grapefruit juice Orange juice     Red grape juice  Do not eat any solid foods, including such foods as: cereal, oatmeal, yogurt, fruits, vegetables, creamed soups, eggs, bread, crackers, pureed foods in a blender, etc.   HELPFUL HINTS FOR DRINKING PREP SOLUTION:   Make sure prep is extremely cold. Mix and refrigerate the the morning of the prep. You may also put in the freezer.   You may try mixing some Crystal Light or Country Time Lemonade if you prefer. Mix in small amounts; add more if necessary.  Try drinking through a straw  Rinse mouth with water or a mouthwash between glasses, to remove after-taste.  Try sipping on a cold beverage /ice/ popsicles between glasses of prep.  Place a piece of sugar-free hard candy in mouth between glasses.  If you become nauseated, try consuming smaller amounts, or stretch out the time between glasses.  Stop for 30-60 minutes, then slowly start back drinking.     OTHER INSTRUCTIONS  You will need a responsible adult at least 58 years of age to accompany you and drive you home. This person must remain in the waiting room during your procedure. The hospital will cancel your procedure if you do not have a responsible adult with you.   1. Wear loose fitting clothing that is easily removed. 2. Leave jewelry and other valuables at home.  3. Remove all body piercing jewelry and leave at home. 4. Total time from sign-in until discharge is approximately 2-3 hours. 5. You should go home directly after your procedure and rest. You can resume normal activities the day after your procedure. 6. The day of your procedure you should not:  Drive  Make legal decisions  Operate machinery  Drink alcohol  Return to work   You may call the office (Dept: [phone]) before 5:00pm, or page the doctor on call [phone]) after 5:00pm, for further instructions, if necessary.   Insurance Information YOU WILL NEED TO CHECK WITH YOUR INSURANCE COMPANY FOR THE BENEFITS OF COVERAGE YOU HAVE FOR THIS PROCEDURE.  UNFORTUNATELY, NOT ALL INSURANCE COMPANIES HAVE BENEFITS TO COVER ALL OR PART OF THESE TYPES OF PROCEDURES.  IT IS YOUR RESPONSIBILITY TO CHECK YOUR BENEFITS, HOWEVER, WE WILL BE GLAD TO ASSIST YOU WITH ANY CODES YOUR INSURANCE COMPANY MAY NEED.    PLEASE NOTE THAT MOST INSURANCE COMPANIES WILL NOT COVER A SCREENING COLONOSCOPY FOR PEOPLE UNDER THE AGE OF 50  IF YOU HAVE BCBS INSURANCE, YOU MAY HAVE BENEFITS FOR A SCREENING COLONOSCOPY BUT IF POLYPS ARE FOUND THE DIAGNOSIS WILL CHANGE AND THEN YOU MAY HAVE A DEDUCTIBLE THAT WILL NEED TO BE MET. SO PLEASE MAKE SURE YOU CHECK YOUR BENEFITS FOR A SCREENING COLONOSCOPY AS WELL AS A DIAGNOSTIC COLONOSCOPY.

## 2017-09-18 NOTE — Progress Notes (Signed)
Appropriate.

## 2017-09-21 DIAGNOSIS — E782 Mixed hyperlipidemia: Secondary | ICD-10-CM | POA: Diagnosis not present

## 2017-09-21 DIAGNOSIS — Z1389 Encounter for screening for other disorder: Secondary | ICD-10-CM | POA: Diagnosis not present

## 2017-09-21 DIAGNOSIS — G4733 Obstructive sleep apnea (adult) (pediatric): Secondary | ICD-10-CM | POA: Diagnosis not present

## 2017-09-21 DIAGNOSIS — E119 Type 2 diabetes mellitus without complications: Secondary | ICD-10-CM | POA: Diagnosis not present

## 2017-09-21 DIAGNOSIS — J984 Other disorders of lung: Secondary | ICD-10-CM | POA: Diagnosis not present

## 2017-09-21 DIAGNOSIS — R7309 Other abnormal glucose: Secondary | ICD-10-CM | POA: Diagnosis not present

## 2017-10-29 DIAGNOSIS — G4730 Sleep apnea, unspecified: Secondary | ICD-10-CM | POA: Diagnosis not present

## 2017-11-06 ENCOUNTER — Encounter (HOSPITAL_COMMUNITY)
Admission: RE | Disposition: A | Discharge status: Home / Self Care | Payer: Self-pay | Source: Ambulatory Visit | Attending: Gastroenterology

## 2017-11-06 ENCOUNTER — Ambulatory Visit (HOSPITAL_COMMUNITY)
Admission: RE | Admit: 2017-11-06 | Discharge: 2017-11-06 | Disposition: A | Discharge status: Home / Self Care | Payer: 59 | Source: Ambulatory Visit | Attending: Gastroenterology | Admitting: Gastroenterology

## 2017-11-06 ENCOUNTER — Encounter (HOSPITAL_COMMUNITY): Payer: Self-pay | Admitting: *Deleted

## 2017-11-06 ENCOUNTER — Other Ambulatory Visit: Payer: Self-pay

## 2017-11-06 DIAGNOSIS — Z79899 Other long term (current) drug therapy: Secondary | ICD-10-CM | POA: Insufficient documentation

## 2017-11-06 DIAGNOSIS — G4733 Obstructive sleep apnea (adult) (pediatric): Secondary | ICD-10-CM | POA: Diagnosis not present

## 2017-11-06 DIAGNOSIS — Z6833 Body mass index (BMI) 33.0-33.9, adult: Secondary | ICD-10-CM | POA: Insufficient documentation

## 2017-11-06 DIAGNOSIS — Z1211 Encounter for screening for malignant neoplasm of colon: Secondary | ICD-10-CM | POA: Diagnosis not present

## 2017-11-06 DIAGNOSIS — K621 Rectal polyp: Secondary | ICD-10-CM | POA: Diagnosis not present

## 2017-11-06 DIAGNOSIS — D124 Benign neoplasm of descending colon: Secondary | ICD-10-CM | POA: Diagnosis not present

## 2017-11-06 DIAGNOSIS — E669 Obesity, unspecified: Secondary | ICD-10-CM | POA: Insufficient documentation

## 2017-11-06 DIAGNOSIS — E785 Hyperlipidemia, unspecified: Secondary | ICD-10-CM | POA: Diagnosis not present

## 2017-11-06 DIAGNOSIS — D123 Benign neoplasm of transverse colon: Secondary | ICD-10-CM | POA: Insufficient documentation

## 2017-11-06 DIAGNOSIS — K644 Residual hemorrhoidal skin tags: Secondary | ICD-10-CM | POA: Diagnosis not present

## 2017-11-06 DIAGNOSIS — Q438 Other specified congenital malformations of intestine: Secondary | ICD-10-CM | POA: Diagnosis not present

## 2017-11-06 DIAGNOSIS — D120 Benign neoplasm of cecum: Secondary | ICD-10-CM | POA: Diagnosis not present

## 2017-11-06 DIAGNOSIS — K648 Other hemorrhoids: Secondary | ICD-10-CM | POA: Diagnosis not present

## 2017-11-06 DIAGNOSIS — D125 Benign neoplasm of sigmoid colon: Secondary | ICD-10-CM | POA: Insufficient documentation

## 2017-11-06 HISTORY — PX: POLYPECTOMY: SHX5525

## 2017-11-06 HISTORY — DX: Personal history of urinary calculi: Z87.442

## 2017-11-06 HISTORY — PX: COLONOSCOPY: SHX5424

## 2017-11-06 SURGERY — COLONOSCOPY
Anesthesia: Moderate Sedation

## 2017-11-06 MED ORDER — MIDAZOLAM HCL 5 MG/5ML IJ SOLN
INTRAMUSCULAR | Status: AC
  Filled 2017-11-06: qty 10

## 2017-11-06 MED ORDER — MEPERIDINE HCL 100 MG/ML IJ SOLN
INTRAMUSCULAR | Status: DC | PRN
  Administered 2017-11-06: 50 mg via INTRAVENOUS

## 2017-11-06 MED ORDER — MEPERIDINE HCL 100 MG/ML IJ SOLN
INTRAMUSCULAR | Status: AC
  Filled 2017-11-06: qty 2

## 2017-11-06 MED ORDER — MIDAZOLAM HCL 5 MG/5ML IJ SOLN
INTRAMUSCULAR | Status: DC | PRN
  Administered 2017-11-06: 2 mg via INTRAVENOUS

## 2017-11-06 MED ORDER — STERILE WATER FOR IRRIGATION IR SOLN
Status: DC | PRN
  Administered 2017-11-06: 12:00:00

## 2017-11-06 MED ORDER — SODIUM CHLORIDE 0.9 % IV SOLN
INTRAVENOUS | Status: DC
  Administered 2017-11-06: 1000 mL via INTRAVENOUS

## 2017-11-06 NOTE — OR Nursing (Signed)
Patient in for a Colonoscopy. Patient states he takes Benicar for his BP. Valsartan shows up on patient's list as a drug that he is not currently taking. Dr. Oneida Alar states she is not comfortable reconciling or discontinuing a medication from the patient's list that she did not prescribe and is not managing. Dr. Oneida Alar reconciled the medications that the patient is currently taking. Patient will be discharged with Valsartan unreconciled per Dr. Oneida Alar.

## 2017-11-06 NOTE — Op Note (Signed)
[facility] Patient Name: Jeffrey Holden Procedure Date: 11/06/2017 11:18 AM MRN: [id number] Date of Birth: March 03, 1961 Attending MD: Barney Drain MD, MD CSN: [id number] Age: 57 Admit Type: Outpatient Procedure:                Colonoscopy WITH COLD SNARE/SNARE CAUTERY                            POLYPECTOMY Indications:              Screening for colorectal malignant neoplasm Providers:                Barney Drain MD, MD, Lurline Del, RN, Nelma Rothman,                            Technician Referring MD:             Halford Chessman MD, MD Medicines:                Meperidine 50 mg IV, Midazolam 2 mg IV Complications:            No immediate complications. Estimated Blood Loss:     Estimated blood loss was minimal. Procedure:                Pre-Anesthesia Assessment:                           - Prior to the procedure, a History and Physical                            was performed, and patient medications and                            allergies were reviewed. The patient's tolerance of                            previous anesthesia was also reviewed. The risks                            and benefits of the procedure and the sedation                            options and risks were discussed with the patient.                            All questions were answered, and informed consent                            was obtained. Prior Anticoagulants: The patient has                            taken no previous anticoagulant or antiplatelet                            agents. ASA Grade Assessment: II - A patient with  mild systemic disease. After reviewing the risks                            and benefits, the patient was deemed in                            satisfactory condition to undergo the procedure.                            After obtaining informed consent, the colonoscope                            was passed under direct vision. Throughout the                    procedure, the patient's blood pressure, pulse, and                            oxygen saturations were monitored continuously. The                            EC-[id number] ([id number]) scope was introduced through                            the anus and advanced to the the cecum, identified                            by appendiceal orifice and ileocecal valve. The                            colonoscopy was somewhat difficult due to a                            tortuous colon. Successful completion of the                            procedure was aided by straightening and shortening                            the scope to obtain bowel loop reduction and                            COLOWRAP. The patient tolerated the procedure well.                            The quality of the bowel preparation was excellent.                            The ileocecal valve, appendiceal orifice, and                            rectum were photographed. Scope In: 11:57:47 AM Scope Out: 12:25:17 PM Scope Withdrawal Time: 0 hours 26 minutes 24 seconds  Total Procedure Duration: 0 hours 27 minutes 30 seconds  Findings:  Three sessile polyps were found in the descending colon and transverse       colon. The polyps were 3 to 5 mm in size. These polyps were removed with       a cold snare. Resection and retrieval were complete.      TWELVE sessile polyps were found in the rectum, sigmoid colon,       descending colon, transverse colon, hepatic flexure and cecum. The       polyps were 5 to 8 mm in size. These polyps were removed with a hot       snare. Resection and retrieval were complete.      The recto-sigmoid colon and sigmoid colon were mildly tortuous.      External and internal hemorrhoids were found. The hemorrhoids were       moderate. Impression:               - Three 3 to 5 mm polyps in the descending colon                            and in the transverse colon, removed with a cold                             snare. Resected and retrieved.                           - TWELVE 5 to 8 mm polyps in the rectum, in the                            sigmoid colon, in the descending colon, in the                            transverse colon, at the hepatic flexure and in the                            cecum, removed with a hot snare. Resected and                            retrieved.                           - Tortuous LEFT colon.                           - External and internal hemorrhoids. Moderate Sedation:      Moderate (conscious) sedation was administered by the endoscopy nurse       and supervised by the endoscopist. The following parameters were       monitored: oxygen saturation, heart rate, blood pressure, and response       to care. Total physician intraservice time was 36 minutes. Recommendation:           - Repeat colonoscopy 1-3 YEARS for surveillance.                           - High fiber diet.                           -  Continue present medications.                           - Await pathology results.                           - Patient has a contact number available for                            emergencies. The signs and symptoms of potential                            delayed complications were discussed with the                            patient. Return to normal activities tomorrow.                            Written discharge instructions were provided to the                            patient. Procedure Code(s):        --- Professional ---                           [phone], Colonoscopy, flexible; with removal of                            tumor(s), polyp(s), or other lesion(s) by snare                            technique                           G0500, Moderate sedation services provided by the                            same physician or other qualified health care                            professional performing a gastrointestinal                             endoscopic service that sedation supports,                            requiring the presence of an independent trained                            observer to assist in the monitoring of the                            patient's level of consciousness and physiological                            status; initial 15 minutes of  intra-service time;                            patient age 51 years or older (additional time may                            be reported with [phone], as appropriate)                           [phone], Moderate sedation services provided by the                            same physician or other qualified health care                            professional performing the diagnostic or                            therapeutic service that the sedation supports,                            requiring the presence of an independent trained                            observer to assist in the monitoring of the                            patient's level of consciousness and physiological                            status; each additional 15 minutes intraservice                            time (List separately in addition to code for                            primary service) Diagnosis Code(s):        --- Professional ---                           Z12.11, Encounter for screening for malignant                            neoplasm of colon                           K62.1, Rectal polyp                           D12.5, Benign neoplasm of sigmoid colon                           D12.4, Benign neoplasm of descending colon                           D12.3, Benign neoplasm of transverse  colon (hepatic                            flexure or splenic flexure)                           D12.0, Benign neoplasm of cecum                           K64.8, Other hemorrhoids                           Q43.8, Other specified congenital malformations of                            intestine CPT copyright 2017  American Medical Association. All rights reserved. The codes documented in this report are preliminary and upon coder review may  be revised to meet current compliance requirements. Barney Drain, MD Barney Drain MD, MD 11/06/2017 12:39:49 PM This report has been signed electronically. Number of Addenda: 0

## 2017-11-06 NOTE — H&P (Signed)
Primary Care Physician:  Sharilyn Sites, MD Primary Gastroenterologist:  Dr. Oneida Alar  Pre-Procedure History & Physical: HPI:  Jeffrey Holden is a 57 y.o. male here for [place].  Past Medical History:  Diagnosis Date  . Dyslipidemia   . Family history of heart disease   . History of kidney stones   . Obesity   . OSA (obstructive sleep apnea)     Past Surgical History:  Procedure Laterality Date  . CARDIAC CATHETERIZATION  05/21/2006   normal coronaries, normal LV function (Dr. Domenic Moras)  . NM MYOCAR PERF WALL MOTION  2012   bruce myoview - normal perfusion in all regions, EF 76%  . TRANSTHORACIC ECHOCARDIOGRAM  2007   EF =>55%; mild MR; trace TR    Prior to Admission medications   Medication Sig Start Date End Date Taking? Authorizing Provider  atorvastatin (LIPITOR) 20 MG tablet TAKE 1 TABLET BY MOUTH EVERY DAY 12/29/16  Yes Hilty, Nadean Corwin, MD  Na Sulfate-K Sulfate-Mg Sulf (SUPREP BOWEL PREP KIT) 17.5-3.13-1.6 GM/177ML SOLN Take 1 kit by mouth as directed. 09/14/17  Yes Annitta Needs, NP  olmesartan (BENICAR) 20 MG tablet Take 20 mg by mouth daily. 08/12/17  Yes [provider]  valsartan-hydrochlorothiazide (DIOVAN-HCT) 80-12.5 MG per tablet Take 1 tablet by mouth daily. Patient not taking: Reported on 11/03/2017 02/15/15   Brett Canales, PA-C    Allergies as of 09/14/2017 - Review Complete 09/14/2017  Allergen Reaction Noted  . Penicillins  12/19/2013    Family History  Problem Relation Age of Onset  . Heart disease Mother   . Coronary artery disease Father        CABG at 23    Social History   Socioeconomic History  . Marital status: Married    Spouse name: Not on file  . Number of children: 3  . Years of education: Not on file  . Highest education level: Not on file  Occupational History  . Not on file  Social Needs  . Financial resource strain: Not on file  . Food insecurity:    Worry: Not on file    Inability: Not on file  .  Transportation needs:    Medical: Not on file    Non-medical: Not on file  Tobacco Use  . Smoking status: Never Smoker  . Smokeless tobacco: Never Used  Substance and Sexual Activity  . Alcohol use: No  . Drug use: No  . Sexual activity: Not on file  Lifestyle  . Physical activity:    Days per week: Not on file    Minutes per session: Not on file  . Stress: Not on file  Relationships  . Social connections:    Talks on phone: Not on file    Gets together: Not on file    Attends religious service: Not on file    Active member of club or organization: Not on file    Attends meetings of clubs or organizations: Not on file    Relationship status: Not on file  . Intimate partner violence:    Fear of current or ex partner: Not on file    Emotionally abused: Not on file    Physically abused: Not on file    Forced sexual activity: Not on file  Other Topics Concern  . Not on file  Social History Narrative  . Not on file    Review of Systems: See HPI, otherwise negative ROS   Physical Exam: BP 127/87  Pulse 65   Temp 98.7 F (37.1 C) (Oral)   Resp 11   Ht _0  (1.651 m)   Wt 200 lb (90.7 kg)   SpO2 98%   BMI 33.28 kg/m  General:   Alert,  pleasant and cooperative in NAD Head:  Normocephalic and atraumatic. Neck:  Supple; Lungs:  Clear throughout to auscultation.    Heart:  Regular rate and rhythm. Abdomen:  Soft, nontender and nondistended. Normal bowel sounds, without guarding, and without rebound.   Neurologic:  Alert and  oriented x4;  grossly normal neurologically.  Impression/Plan:     SCREENING  Plan:  1. TCS TODAY. DISCUSSED PROCEDURE, BENEFITS, & RISKS: < 1% chance of medication reaction, bleeding, perforation, or rupture of spleen/liver.

## 2017-11-06 NOTE — Discharge Instructions (Signed)
You had 15 polyps removed. You have moderate internal and external hemorrhoids.   DRINK WATER TO KEEP YOUR URINE LIGHT YELLOW.  FOLLOW A HIGH FIBER DIET. AVOID ITEMS THAT CAUSE BLOATING & GAS. SEE INFO BELOW.  CONTINUE YOUR WEIGHT LOSS EFFORTS.  WHILE I DO NOT WANT TO ALARM YOU, YOUR BODY MASS INDEX(BMI) IS OVER 30 WHICH MEANS YOU ARE OBESE. OBESITY IS ASSOCIATED WITH AN INCREASED RISK FOR CIRRHOSIS AND ALL CANCERS, INCLUDING ESOPHAGEAL AND COLON CANCER. A WEIGHT OF 175 LBS or less   WILL GET YOUR BODY MASS INDEX(BMI) UNDER 30.  YOUR BIOPSY RESULTS WILL BE AVAILABLE IN 7 days.   Next colonoscopy in 1-3 years. YOUR SISTERS, BROTHERS, CHILDREN, AND PARENTS NEED TO HAVE A COLONOSCOPY STARTING AT THE AGE OF 40.     Colonoscopy Care After Read the instructions outlined below and refer to this sheet in the next week. These discharge instructions provide you with general information on caring for yourself after you leave the hospital. While your treatment has been planned according to the most current medical practices available, unavoidable complications occasionally occur. If you have any problems or questions after discharge, call DR. FIELDS, [phone].  ACTIVITY  You may resume your regular activity, but move at a slower pace for the next 24 hours.   Take frequent rest periods for the next 24 hours.   Walking will help get rid of the air and reduce the bloated feeling in your belly (abdomen).   No driving for 24 hours (because of the medicine (anesthesia) used during the test).   You may shower.   Do not sign any important legal documents or operate any machinery for 24 hours (because of the anesthesia used during the test).    NUTRITION  Drink plenty of fluids.   You may resume your normal diet as instructed by your doctor.   Begin with a light meal and progress to your normal diet. Heavy or fried foods are harder to digest and may make you feel sick to your stomach  (nauseated).   Avoid alcoholic beverages for 24 hours or as instructed.    MEDICATIONS  You may resume your normal medications.   WHAT YOU CAN EXPECT TODAY  Some feelings of bloating in the abdomen.   Passage of more gas than usual.   Spotting of blood in your stool or on the toilet paper  .  IF YOU HAD POLYPS REMOVED DURING THE COLONOSCOPY:  Eat a soft diet IF YOU HAVE NAUSEA, BLOATING, ABDOMINAL PAIN, OR VOMITING.    FINDING OUT THE RESULTS OF YOUR TEST Not all test results are available during your visit. DR. Oneida Alar WILL CALL YOU WITHIN 14 DAYS OF YOUR PROCEDUE WITH YOUR RESULTS. Do not assume everything is normal if you have not heard from DR. FIELDS, CALL HER OFFICE AT [phone].  SEEK IMMEDIATE MEDICAL ATTENTION AND CALL THE OFFICE: [phone] IF:  You have more than a spotting of blood in your stool.   Your belly is swollen (abdominal distention).   You are nauseated or vomiting.   You have a temperature over 101F.   You have abdominal pain or discomfort that is severe or gets worse throughout the day.   High-Fiber Diet A high-fiber diet changes your normal diet to include more whole grains, legumes, fruits, and vegetables. Changes in the diet involve replacing refined carbohydrates with unrefined foods. The calorie level of the diet is essentially unchanged. The Dietary Reference Intake (recommended amount) for adult males is 16  grams per day. For adult females, it is 25 grams per day. Pregnant and lactating women should consume 28 grams of fiber per day. Fiber is the intact part of a plant that is not broken down during digestion. Functional fiber is fiber that has been isolated from the plant to provide a beneficial effect in the body. PURPOSE  Increase stool bulk.   Ease and regulate bowel movements.   Lower cholesterol.   REDUCE RISK OF COLON CANCER  INDICATIONS THAT YOU NEED MORE FIBER  Constipation and hemorrhoids.   Uncomplicated  diverticulosis (intestine condition) and irritable bowel syndrome.   Weight management.   As a protective measure against hardening of the arteries (atherosclerosis), diabetes, and cancer.   GUIDELINES FOR INCREASING FIBER IN THE DIET  Start adding fiber to the diet slowly. A gradual increase of about 5 more grams (2 slices of whole-wheat bread, 2 servings of most fruits or vegetables, or 1 bowl of high-fiber cereal) per day is best. Too rapid an increase in fiber may result in constipation, flatulence, and bloating.   Drink enough water and fluids to keep your urine clear or pale yellow. Water, juice, or caffeine-free drinks are recommended. Not drinking enough fluid may cause constipation.   Eat a variety of high-fiber foods rather than one type of fiber.   Try to increase your intake of fiber through using high-fiber foods rather than fiber pills or supplements that contain small amounts of fiber.   The goal is to change the types of food eaten. Do not supplement your present diet with high-fiber foods, but replace foods in your present diet.   INCLUDE A VARIETY OF FIBER SOURCES  Replace refined and processed grains with whole grains, canned fruits with fresh fruits, and incorporate other fiber sources. White rice, white breads, and most bakery goods contain little or no fiber.   Brown whole-grain rice, buckwheat oats, and many fruits and vegetables are all good sources of fiber. These include: broccoli, Brussels sprouts, cabbage, cauliflower, beets, sweet potatoes, white potatoes (skin on), carrots, tomatoes, eggplant, squash, berries, fresh fruits, and dried fruits.   Cereals appear to be the richest source of fiber. Cereal fiber is found in whole grains and bran. Bran is the fiber-rich outer coat of cereal grain, which is largely removed in refining. In whole-grain cereals, the bran remains. In breakfast cereals, the largest amount of fiber is found in those with "bran" in their names.  The fiber content is sometimes indicated on the label.   You may need to include additional fruits and vegetables each day.   In baking, for 1 cup white flour, you may use the following substitutions:   1 cup whole-wheat flour minus 2 tablespoons.   1/2 cup white flour plus 1/2 cup whole-wheat flour.   Polyps, Colon  A polyp is extra tissue that grows inside your body. Colon polyps grow in the large intestine. The large intestine, also called the colon, is part of your digestive system. It is a long, hollow tube at the end of your digestive tract where your body makes and stores stool. Most polyps are not dangerous. They are benign. This means they are not cancerous. But over time, some types of polyps can turn into cancer. Polyps that are smaller than a pea are usually not harmful. But larger polyps could someday become or may already be cancerous. To be safe, doctors remove all polyps and test them.   WHO GETS POLYPS? Anyone can get polyps, but certain people  are more likely than others. You may have a greater chance of getting polyps if:  You are over 50.   You have had polyps before.   Someone in your family has had polyps.   Someone in your family has had cancer of the large intestine.   Find out if someone in your family has had polyps. You may also be more likely to get polyps if you:   Eat a lot of fatty foods   Smoke   Drink alcohol   Do not exercise  Eat too much   PREVENTION There is not one sure way to prevent polyps. You might be able to lower your risk of getting them if you:  Eat more fruits and vegetables and less fatty food.   Do not smoke.   Avoid alcohol.   Exercise every day.   Lose weight if you are overweight.   Eating more calcium and folate can also lower your risk of getting polyps. Some foods that are rich in calcium are milk, cheese, and broccoli. Some foods that are rich in folate are chickpeas, kidney beans, and spinach.    Hemorrhoids Hemorrhoids are dilated (enlarged) veins around the rectum. Sometimes clots will form in the veins. This makes them swollen and painful. These are called thrombosed hemorrhoids. Causes of hemorrhoids include:  Constipation.   Straining to have a bowel movement.   HEAVY LIFTING  HOME CARE INSTRUCTIONS  Eat a well balanced diet and drink 6 to 8 glasses of water every day to avoid constipation. You may also use a bulk laxative.   Avoid straining to have bowel movements.   Keep anal area dry and clean.   Do not use a donut shaped pillow or sit on the toilet for long periods. This increases blood pooling and pain.   Move your bowels when your body has the urge; this will require less straining and will decrease pain and pressure.

## 2017-11-12 ENCOUNTER — Telehealth: Payer: Self-pay | Admitting: Gastroenterology

## 2017-11-12 ENCOUNTER — Encounter (HOSPITAL_COMMUNITY): Payer: Self-pay | Admitting: Gastroenterology

## 2017-11-12 NOTE — Telephone Encounter (Signed)
Please call pt. HE HAD ONE SERRATED adenoma, TWELVE SIMPLE ADENOMAS, AND TWO HYPERPLASTIC POLYPS removed from her colon.      DRINK WATER TO KEEP YOUR URINE LIGHT YELLOW.  FOLLOW A HIGH FIBER DIET. AVOID ITEMS THAT CAUSE BLOATING & GAS.   CONTINUE YOUR WEIGHT LOSS EFFORTS.  A WEIGHT OF 175 LBS or less   WILL GET YOUR BODY MASS INDEX(BMI) UNDER 30.  YOU SHOULD CONSIDER GENETIC TESTING FOR COLON POLYPS SYNDROMES.  Next colonoscopy in 3 years. YOUR SISTERS, BROTHERS, CHILDREN, AND PARENTS NEED TO HAVE A COLONOSCOPY STARTING AT THE AGE OF 40.

## 2017-11-12 NOTE — Telephone Encounter (Signed)
Pt is aware.  

## 2017-11-12 NOTE — Telephone Encounter (Signed)
Reminder in epic °

## 2017-11-23 DIAGNOSIS — G4733 Obstructive sleep apnea (adult) (pediatric): Secondary | ICD-10-CM | POA: Diagnosis not present

## 2017-11-24 DIAGNOSIS — G4733 Obstructive sleep apnea (adult) (pediatric): Secondary | ICD-10-CM | POA: Diagnosis not present

## 2018-02-10 DIAGNOSIS — Z1389 Encounter for screening for other disorder: Secondary | ICD-10-CM | POA: Diagnosis not present

## 2018-02-10 DIAGNOSIS — E782 Mixed hyperlipidemia: Secondary | ICD-10-CM | POA: Diagnosis not present

## 2018-02-10 DIAGNOSIS — I10 Essential (primary) hypertension: Secondary | ICD-10-CM | POA: Diagnosis not present

## 2018-02-10 DIAGNOSIS — Z0000 Encounter for general adult medical examination without abnormal findings: Secondary | ICD-10-CM | POA: Diagnosis not present

## 2018-02-22 ENCOUNTER — Other Ambulatory Visit (HOSPITAL_COMMUNITY): Payer: Self-pay | Admitting: Family Medicine

## 2018-02-22 DIAGNOSIS — R918 Other nonspecific abnormal finding of lung field: Secondary | ICD-10-CM

## 2018-02-22 DIAGNOSIS — Z8249 Family history of ischemic heart disease and other diseases of the circulatory system: Secondary | ICD-10-CM

## 2018-03-01 ENCOUNTER — Ambulatory Visit (HOSPITAL_COMMUNITY)
Admission: RE | Admit: 2018-03-01 | Discharge: 2018-03-01 | Disposition: A | Discharge status: Home / Self Care | Payer: 59 | Source: Ambulatory Visit | Attending: Family Medicine | Admitting: Family Medicine

## 2018-03-01 DIAGNOSIS — Z8249 Family history of ischemic heart disease and other diseases of the circulatory system: Secondary | ICD-10-CM | POA: Insufficient documentation

## 2018-03-01 DIAGNOSIS — I700 Atherosclerosis of aorta: Secondary | ICD-10-CM | POA: Diagnosis not present

## 2018-03-01 DIAGNOSIS — R918 Other nonspecific abnormal finding of lung field: Secondary | ICD-10-CM | POA: Insufficient documentation

## 2018-03-01 DIAGNOSIS — J984 Other disorders of lung: Secondary | ICD-10-CM | POA: Diagnosis present

## 2019-01-18 IMAGING — CT CT CHEST W/O CM
2 of 3 series · 15 of 36 positions shown, 18 images · non-contrast
Comparison: 12/11/2016

CLINICAL DATA: Followup pulmonary nodules.

EXAM:
CT CHEST WITHOUT CONTRAST
TECHNIQUE: Multidetector CT imaging of the chest was performed following the
standard protocol without IV contrast.

[Series 2: thorax · axial · 0.77mm/px · z∈[+1263,+1539]mm · 12 of 163 slices shown, 15 images]
[im 13/163  mediastinal]
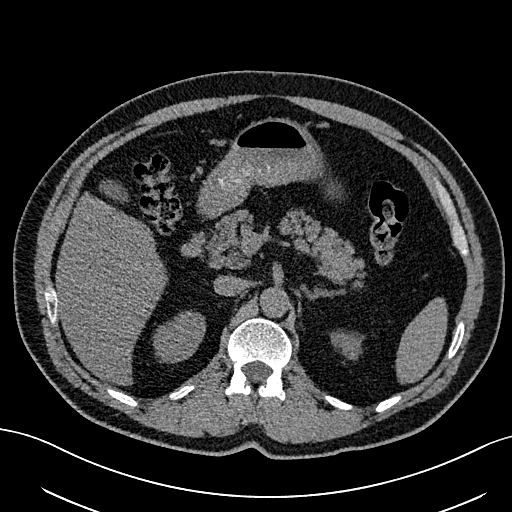
[im 13/163  lung]
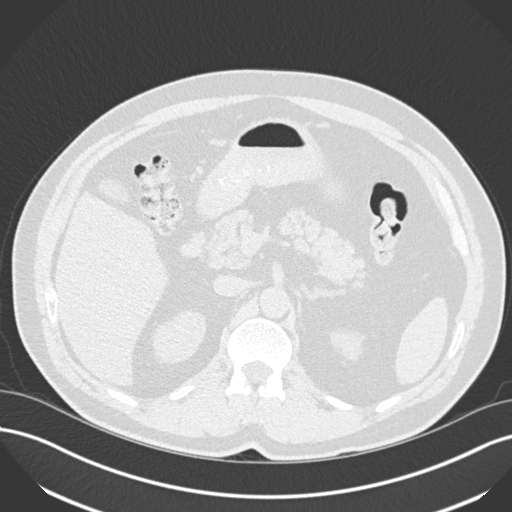
[im 25/163  lung]
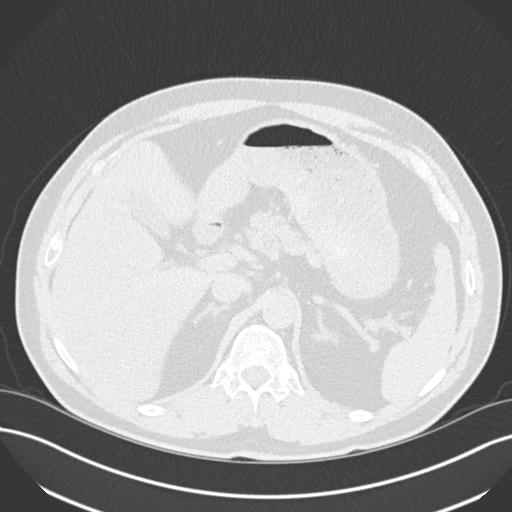
[im 37/163  lung]
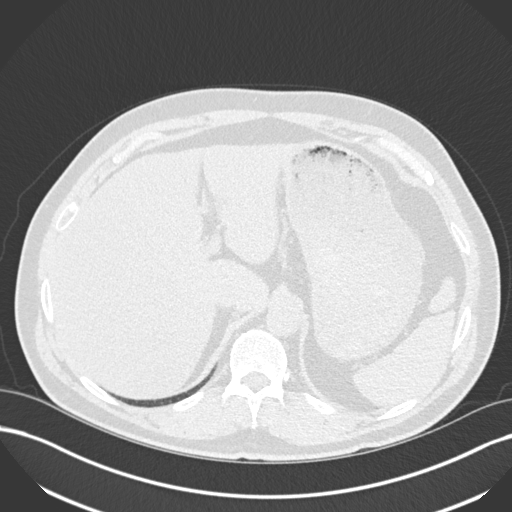
[im 49/163  lung]
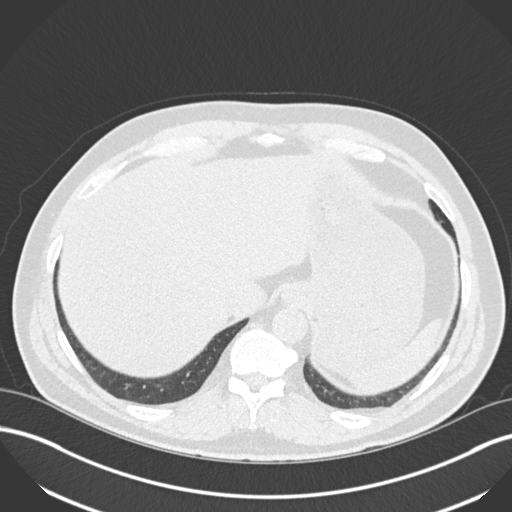
[im 61/163  mediastinal]
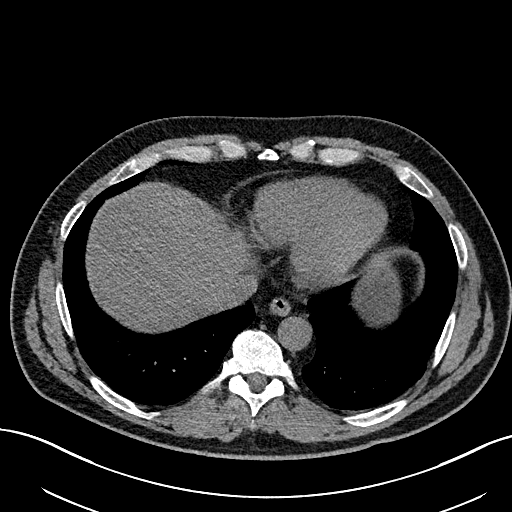
[im 61/163  lung]
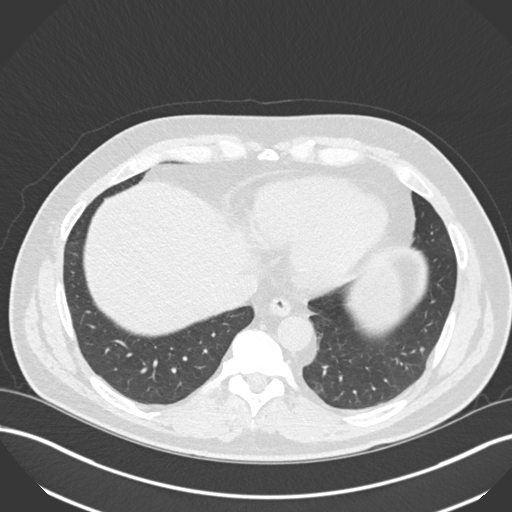
[im 73/163  lung]
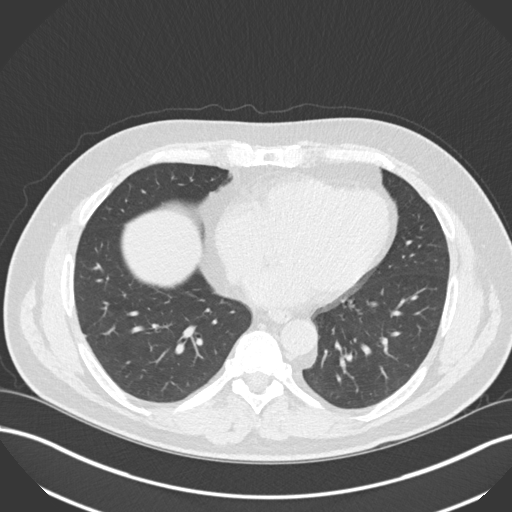
[im 91/163  lung]
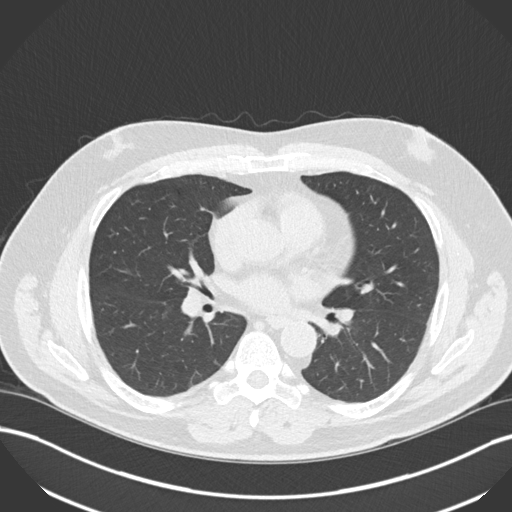
[im 103/163  lung]
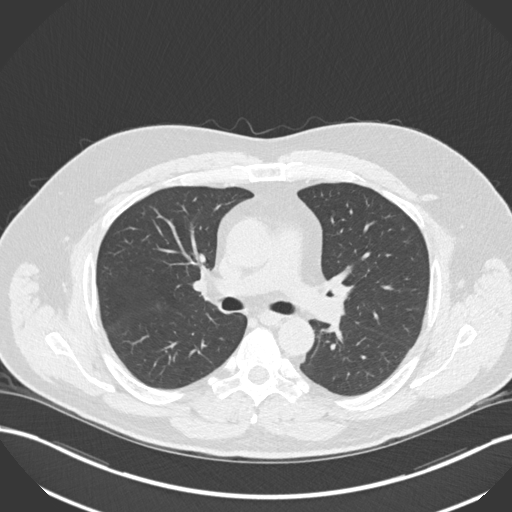
[im 115/163  mediastinal]
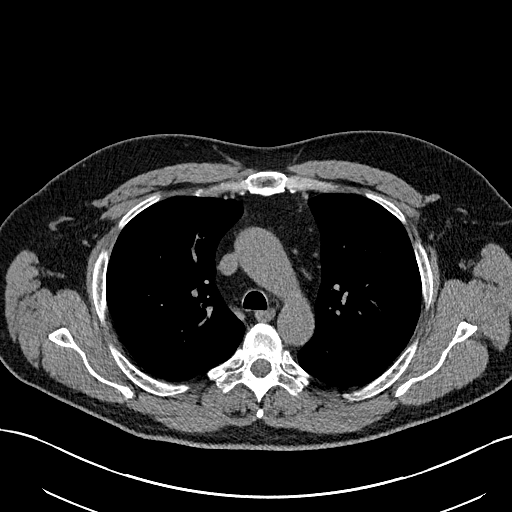
[im 115/163  lung]
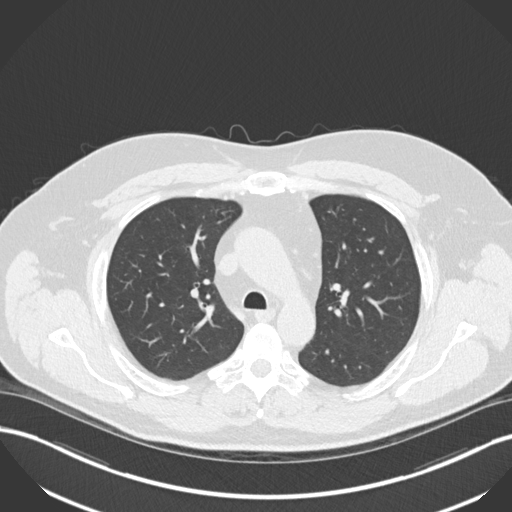
[im 127/163  lung]
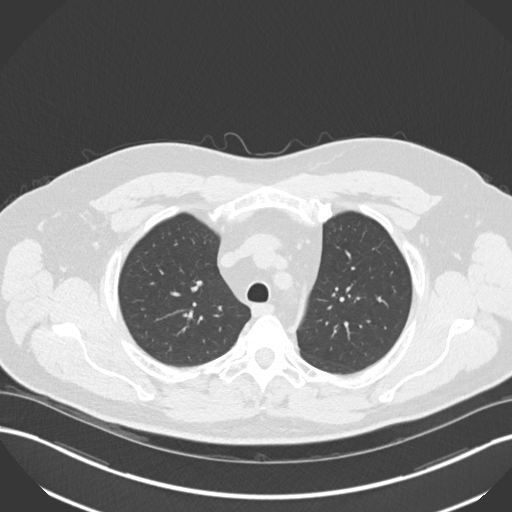
[im 139/163  lung]
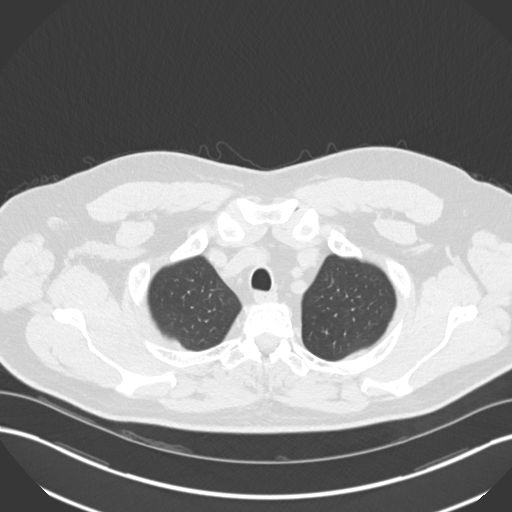
[im 151/163  lung]
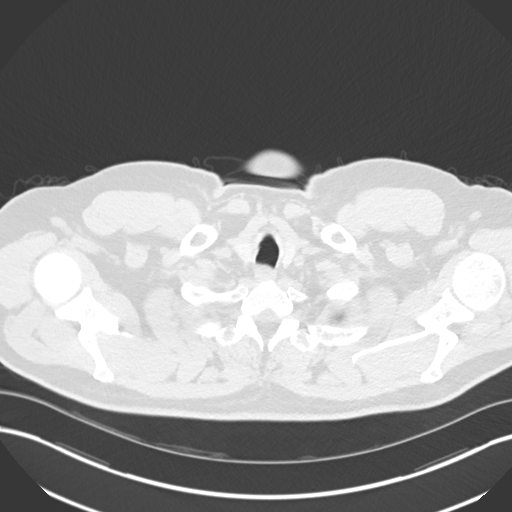

[Series 5: coronal · coronal · 0.66mm/px · 3 of 146 slices shown]
[im 30/146  lung]
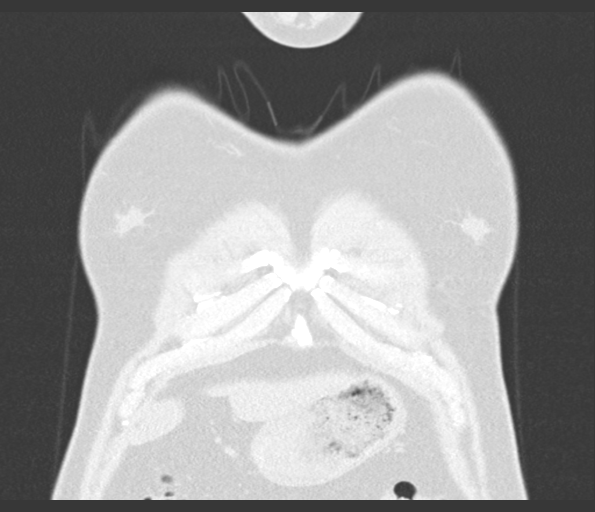
[im 59/146  lung]
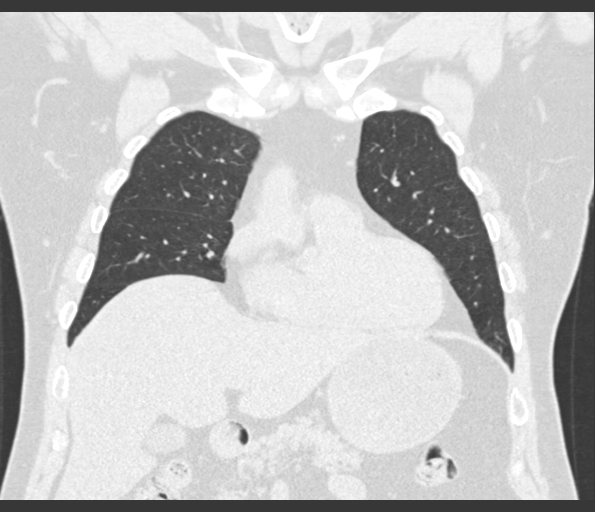
[im 88/146  lung]
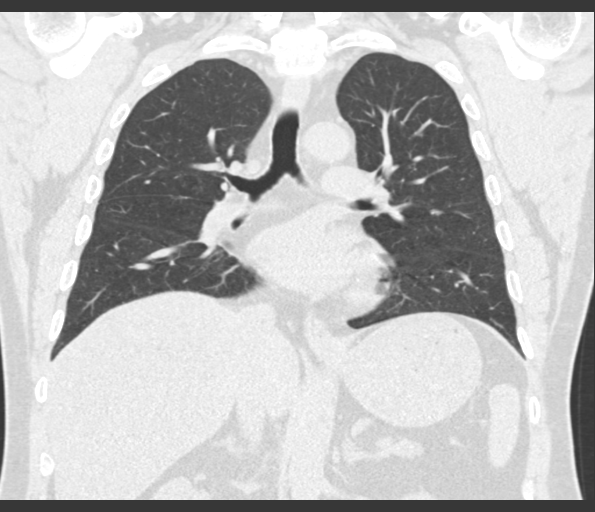

[15 of 36 positions shown; findings below may reference images not displayed]

FINDINGS: Cardiovascular: Heart size appears within normal limits. Aortic
atherosclerosis.

Mediastinum/Nodes: No enlarged mediastinal or axillary lymph nodes.
Thyroid gland, trachea, and esophagus demonstrate no significant
findings.

Lungs/Pleura: No pleural effusion. Scattered small pulmonary nodules
are again noted. The index nodule in the right lower lobe is stable
measuring 6 mm, image 89/4. Adjacent subpleural nodule is also
unchanged measuring 3 mm. Other small pulmonary nodules are
unchanged.

Upper Abdomen: Diffuse hepatic steatosis. No acute findings
identified.

Musculoskeletal: No chest wall mass or suspicious bone lesions
identified.
IMPRESSION: 1. Pulmonary nodules are stable compared with 04/25/2016. Findings
are compatible with a benign process and no further follow-up is
indicated at this time. This recommendation follows the consensus
statement: Guidelines for Management of Incidental Pulmonary Nodules
Detected on CT Images:From the [HOSPITAL] 9610; published
online before print (10.1148/radiol.3241494937).
2.  Aortic Atherosclerosis (HC3KB-JCS.S).

## 2020-09-04 ENCOUNTER — Encounter: Payer: Self-pay | Admitting: Internal Medicine

## 2022-10-18 NOTE — Progress Notes (Unsigned)
Cardiology Office Note:    Date:  10/21/2022   ID:  Jeffrey Holden, DOB 1961-04-10, MRN [id number]  PCP:  Assunta Found, MD   [facility] Providers Cardiologist:  None     Referring MD: Assunta Found, MD   Chief Complaint  Patient presents with   Follow-up    FAMILY OF HEART DX PATERNAL NEEDS TO DISCUSS IF HE IS TO TAKE THE BP MEDS   Chest Pain    History of Present Illness:    Jeffrey Holden is a 62 y.o. male seen at the request of Dr Phillips Odor for evaluation of CV risk. He has a history of HLD, obesity with OSA, and family history of CAD. He was seen previously by Dr Rennis Golden - last in 2015. He had a ETT which was negative and a cardiac catheterization in 2007 which notes indicated no significant coronary disease. He also had a repeat Myoview stress test in 2012 which showed no ischemia and EF of 76%. He had good exercise capacity exercising for 11 minutes.   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 No regular exercise. Has prediabetes. He is concerned about his family history. Father had CABG age 51. He does note symptoms of chest heaviness when he is under stress. Also notes occasional sharp pain in his left arm. He is not on antihypertensive therapy now. Thinks he is on atorvastatin but hasn't been taking regularly   Past Medical History:  Diagnosis Date   Dyslipidemia    Family history of heart disease    History of kidney stones    Hyperlipidemia    Obesity    OSA (obstructive sleep apnea)     Past Surgical History:  Procedure Laterality Date   CARDIAC CATHETERIZATION  05/21/2006   normal coronaries, normal LV function (Dr. Aram Candela)   COLONOSCOPY N/A 11/06/2017   Procedure: COLONOSCOPY;  Surgeon: West Bali, MD;  Location: [facility];  Service: Endoscopy;  Laterality: N/A;  12:15   NM MYOCAR PERF WALL MOTION  2012   bruce myoview - normal perfusion in all regions, EF 76%   POLYPECTOMY  11/06/2017   Procedure: POLYPECTOMY;  Surgeon: West Bali, MD;   Location: [facility];  Service: Endoscopy;;  cecal, transverse, hepatic flexure, sigmoid, and rectal   TRANSTHORACIC ECHOCARDIOGRAM  2007   EF =>55%; mild MR; trace TR    Current Medications: Current Meds  Medication Sig   atorvastatin (LIPITOR) 20 MG tablet TAKE 1 TABLET BY MOUTH EVERY DAY   Vitamin D, Ergocalciferol, (DRISDOL) 1.25 MG (50000 UNIT) CAPS capsule Take 50,000 Units by mouth once a week.   [DISCONTINUED] olmesartan (BENICAR) 20 MG tablet Take 20 mg by mouth daily.   [DISCONTINUED] valsartan-hydrochlorothiazide (DIOVAN-HCT) 80-12.5 MG per tablet Take 1 tablet by mouth daily.     Allergies:   Penicillins   Social History   Socioeconomic History   Marital status: Married    Spouse name: Not on file   Number of children: 3   Years of education: Not on file   Highest education level: Not on file  Occupational History   Not on file  Tobacco Use   Smoking status: Never   Smokeless tobacco: Never  Vaping Use   Vaping Use: Never used  Substance and Sexual Activity   Alcohol use: No   Drug use: No   Sexual activity: Not on file  Other Topics Concern   Not on file  Social History Narrative   Music therapist.  Social Determinants of Health   Financial Resource Strain: Not on file  Food Insecurity: Not on file  Transportation Needs: Not on file  Physical Activity: Not on file  Stress: Not on file  Social Connections: Not on file     Family History: The patient's family history includes Coronary artery disease in his father.  ROS:   Please see the history of present illness.     All other systems reviewed and are negative.  EKGs/Labs/Other Studies Reviewed:    The following studies were reviewed today: See HPI  EKG:  EKG is  ordered today.  The ekg ordered today demonstrates NSR rate 56. Normal. I have personally reviewed and interpreted this study.   Recent Labs: No results found for requested labs within last 365 days.  Recent  Lipid Panel    Component Value Date/Time   CHOL 204 (H) 03/26/2015 1225   TRIG 174 (H) 03/26/2015 1225   HDL 29 (L) 03/26/2015 1225   CHOLHDL 7.0 (H) 03/26/2015 1225   VLDL 35 (H) 03/26/2015 1225   LDLCALC 140 (H) 03/26/2015 1225   Dated 03/19/20: cholesterol 232, triglycerides 229, HDL 31, LDL 158. CMET and CBC normal Dated 09/30/22 A1c 6.5%.  Risk Assessment/Calculations:                Physical Exam:    VS:  BP 132/80   Pulse (!) 56   Ht 5\' 5"  (1.651 m)   Wt 184 lb 6.4 oz (83.6 kg)   SpO2 94%   BMI 30.69 kg/m     Wt Readings from Last 3 Encounters:  10/21/22 184 lb 6.4 oz (83.6 kg)  11/06/17 200 lb (90.7 kg)  07/20/16 200 lb (90.7 kg)     GEN:  Well nourished, well developed in no acute distress HEENT: Normal NECK: No JVD; No carotid bruits LYMPHATICS: No lymphadenopathy CARDIAC: RRR, no murmurs, rubs, gallops RESPIRATORY:  Clear to auscultation without rales, wheezing or rhonchi  ABDOMEN: Soft, non-tender, non-distended MUSCULOSKELETAL:  No edema; No deformity  SKIN: Warm and dry NEUROLOGIC:  Alert and oriented x 3 PSYCHIATRIC:  Normal affect   ASSESSMENT:    1. Chest pain, precordial   2. Mixed hyperlipidemia   3. Prediabetes    PLAN:    In order of problems listed above:  Patient is at higher CV risk given HLD, prediabetes and family history of premature CAD. Last ETT in 2015. He does have some anginal symptoms with rest. Discussed options for repeat ischemic evaluation. Recommend coronary CTA. Patient is agreeable.  HLD mixed. Encouraged him to take atorvastatin 20 mg daily. Will update lipid panel. Pre diabetes. A1c 6.5%. discussed carbohydrate modified diet. Family history of premature CAD           Medication Adjustments/Labs and Tests Ordered: Current medicines are reviewed at length with the patient today.  Concerns regarding medicines are outlined above.  No orders of the defined types were placed in this encounter.  No orders of the  defined types were placed in this encounter.   There are no Patient Instructions on file for this visit.   Signed, Simisola Sandles [place], MD  10/21/2022 10:16 AM    [facility]

## 2022-10-21 ENCOUNTER — Ambulatory Visit: Payer: 59 | Attending: Cardiology | Admitting: Cardiology

## 2022-10-21 ENCOUNTER — Encounter: Payer: Self-pay | Admitting: Cardiology

## 2022-10-21 VITALS — BP 132/80 | HR 56 | Ht 65.0 in | Wt 184.4 lb

## 2022-10-21 DIAGNOSIS — R7303 Prediabetes: Secondary | ICD-10-CM

## 2022-10-21 DIAGNOSIS — E782 Mixed hyperlipidemia: Secondary | ICD-10-CM

## 2022-10-21 DIAGNOSIS — R072 Precordial pain: Secondary | ICD-10-CM

## 2022-10-21 LAB — BASIC METABOLIC PANEL
BUN/Creatinine Ratio: 20 (ref 10–24)
BUN: 20 mg/dL (ref 8–27)
CO2: 23 mmol/L (ref 20–29)
Calcium: 10 mg/dL (ref 8.6–10.2)
Chloride: 104 mmol/L (ref 96–106)
Creatinine, Ser: 0.98 mg/dL (ref 0.76–1.27)
Glucose: 91 mg/dL (ref 70–99)
Potassium: 5.2 mmol/L (ref 3.5–5.2)
Sodium: 139 mmol/L (ref 134–144)
eGFR: 87 mL/min/{1.73_m2} (ref >59)

## 2022-10-21 LAB — LIPID PANEL
Chol/HDL Ratio: 3 ratio (ref 0.0–5.0)
Cholesterol, Total: 92 mg/dL — ABNORMAL LOW (ref 100–199)
HDL: 31 mg/dL — ABNORMAL LOW (ref >39)
LDL Chol Calc (NIH): 44 mg/dL (ref 0–99)
Triglycerides: 86 mg/dL (ref 0–149)
VLDL Cholesterol Cal: 17 mg/dL (ref 5–40)

## 2022-10-21 LAB — HEPATIC FUNCTION PANEL
ALT: 47 IU/L — ABNORMAL HIGH (ref 0–44)
AST: 29 IU/L (ref 0–40)
Albumin: 4.8 g/dL (ref 3.9–4.9)
Alkaline Phosphatase: 70 IU/L (ref 44–121)
Bilirubin Total: 0.4 mg/dL (ref 0.0–1.2)
Bilirubin, Direct: 0.13 mg/dL (ref 0.00–0.40)
Total Protein: 7 g/dL (ref 6.0–8.5)

## 2022-10-21 MED ORDER — METOPROLOL TARTRATE 25 MG PO TABS: ORAL_TABLET | ORAL | 0 refills | Status: DC

## 2022-10-21 NOTE — Patient Instructions (Addendum)
Medication Instructions:  Continue same medications   Lab Work: Bmet,lipid and hepatic panels today   Testing/Procedures: Coronary CT will be scheduled after approved by insurance Follow instructions below   Follow-Up: At [facility], you and your health needs are our priority.  As part of our continuing mission to provide you with exceptional heart care, we have created designated Provider Care Teams.  These Care Teams include your primary Cardiologist (physician) and Advanced Practice Providers (APPs -  Physician Assistants and Nurse Practitioners) who all work together to provide you with the care you need, when you need it.  We recommend signing up for the patient portal called "MyChart".  Sign up information is provided on this After Visit Summary.  MyChart is used to connect with patients for Virtual Visits (Telemedicine).  Patients are able to view lab/test results, encounter notes, upcoming appointments, etc.  Non-urgent messages can be sent to your provider as well.   To learn more about what you can do with MyChart, go to [email or web address].    Your next appointment:  2 months after Coronary CT    Provider:  Dr.Jordan's PA     Your cardiac CT will be scheduled at one of the below locations:   [facility] [address] [place], [place] [address] [phone]  OR  [facility] [address] [place], [place] [address] [phone]  OR   [facility] [address] [place], [place] [address] [phone]  If scheduled at [facility], please arrive at the [facility] and Children's Entrance (Entrance C2) of [facility] 30 minutes prior to test start time. You can use the FREE valet parking offered at entrance C (encouraged to control the heart rate for the test)  Proceed to the [facility] Radiology Department (first floor) to check-in and test  prep.  All radiology patients and guests should use entrance C2 at [facility], accessed from [facility], even though the hospital's physical address listed is [address].    If scheduled at [facility] or [facility], please arrive 15 mins early for check-in and test prep.   Please follow these instructions carefully (unless otherwise directed):  Hold all erectile dysfunction medications at least 3 days (72 hrs) prior to test. (Ie viagra, cialis, sildenafil, tadalafil, etc) We will administer nitroglycerin during this exam.   On the Night Before the Test: Be sure to Drink plenty of water. Do not consume any caffeinated/decaffeinated beverages or chocolate 12 hours prior to your test. Do not take any antihistamines 12 hours prior to your test.   On the Day of the Test: Drink plenty of water until 1 hour prior to the test. Do not eat any food 1 hour prior to test. You may take your regular medications prior to the test.  Take metoprolol 25 mg two hours prior to test.        After the Test: Drink plenty of water. After receiving IV contrast, you may experience a mild flushed feeling. This is normal. On occasion, you may experience a mild rash up to 24 hours after the test. This is not dangerous. If this occurs, you can take Benadryl 25 mg and increase your fluid intake. If you experience trouble breathing, this can be serious. If it is severe call 911 IMMEDIATELY. If it is mild, please call our office.  We will call to schedule your test  2-4 weeks out understanding that some insurance companies will need an authorization prior to the service being performed.   For non-scheduling related questions, please contact the cardiac imaging nurse navigator should you have any questions/concerns: Rockwell Alexandria, Cardiac Imaging Nurse Navigator Larey Brick, Cardiac Imaging Nurse Navigator [facility] Direct Office Dial: [phone]   For scheduling needs, including cancellations and rescheduling, please call [place], [phone].

## 2022-10-22 ENCOUNTER — Telehealth: Payer: Self-pay | Admitting: *Deleted

## 2022-10-22 ENCOUNTER — Telehealth: Payer: Self-pay | Admitting: Cardiology

## 2022-10-22 NOTE — Telephone Encounter (Signed)
-----   Message from Peter M Swaziland, MD sent at 10/22/2022  7:11 AM EDT ----- All values are normal or within acceptable limits.   Medication changes / Follow up labs / Other changes or recommendations:   Chemistries are normal.. lipids are excellent  Peter Swaziland, MD 10/22/2022 7:11 AM

## 2022-10-22 NOTE — Telephone Encounter (Signed)
Called patient. Provided him phone # to r/s CT (as noted on AVS). Provided him lab results. No further assistance needed

## 2022-10-22 NOTE — Telephone Encounter (Signed)
Patient aware of results.

## 2022-10-22 NOTE — Telephone Encounter (Signed)
Patient is returning phone call.  °

## 2022-10-22 NOTE — Telephone Encounter (Signed)
Patient is calling because he just scheduled his CT Cardiac Morph, but unfortunately is not able to get off work for that day. Patient is needing to r/s that appt. Please advise.

## 2022-10-22 NOTE — Telephone Encounter (Signed)
Called unable to leave message  voicemail

## 2022-10-28 ENCOUNTER — Ambulatory Visit (HOSPITAL_COMMUNITY): Payer: 59

## 2022-11-05 ENCOUNTER — Telehealth (HOSPITAL_COMMUNITY): Payer: Self-pay | Admitting: *Deleted

## 2022-11-05 NOTE — Telephone Encounter (Signed)
Reaching out to patient to offer assistance regarding upcoming cardiac imaging study; pt verbalizes understanding of appt date/time, parking situation and where to check in, pre-test NPO status and medications ordered, and verified current allergies; name and call back number provided for further questions should they arise  Larey Brick RN Navigator Cardiac Imaging [facility] [phone] office [phone] cell  Patient to take 25mg  metoprolol tartrate two hours prior to his cardiac CT scan if his HR is greater than 65bpm.  He is aware to arrive at 7:30am.

## 2022-11-07 ENCOUNTER — Ambulatory Visit (HOSPITAL_COMMUNITY)
Admission: RE | Admit: 2022-11-07 | Discharge: 2022-11-07 | Disposition: A | Discharge status: Home / Self Care | Payer: 59 | Source: Ambulatory Visit | Attending: Cardiology | Admitting: Cardiology

## 2022-11-07 DIAGNOSIS — R072 Precordial pain: Secondary | ICD-10-CM | POA: Diagnosis present

## 2022-11-07 DIAGNOSIS — E782 Mixed hyperlipidemia: Secondary | ICD-10-CM | POA: Diagnosis present

## 2022-11-07 DIAGNOSIS — R7303 Prediabetes: Secondary | ICD-10-CM | POA: Diagnosis present

## 2022-11-07 MED ORDER — IOHEXOL 350 MG/ML SOLN
100.0000 mL | Freq: Once | INTRAVENOUS | Status: AC | PRN
  Administered 2022-11-07: 100 mL via INTRAVENOUS

## 2022-11-07 MED ORDER — NITROGLYCERIN 0.4 MG SL SUBL
0.8000 mg | SUBLINGUAL_TABLET | Freq: Once | SUBLINGUAL | Status: AC
  Administered 2022-11-07: 0.8 mg via SUBLINGUAL

## 2022-11-07 MED ORDER — NITROGLYCERIN 0.4 MG SL SUBL
SUBLINGUAL_TABLET | SUBLINGUAL | Status: AC
  Filled 2022-11-07: qty 2

## 2023-01-12 NOTE — Progress Notes (Signed)
Cardiology Office Note:  .   Date:  01/13/2023  ID:  Jeffrey Holden, DOB 08/13/1960, MRN [id number] PCP: Assunta Found, MD   HeartCare Providers Cardiologist:  Peter Swaziland, MD    History of Present Illness: .   Jeffrey Holden is a 61 y.o. male with past medical history of HLD, obesity with OSA, prediabetes and family history of CAD. Initially seen by Dr. Rennis Golden in 2012 for chest pain. In 2007 he had a stress test and cardiac catheterization that showed no significant coronary artery disease. In 2012 he had a repeat stress test which showed no ischemia and EF of 76%. He had an exercise treadmill stress test in 2015 that was negative for ischemia. He was then lost to follow up.   He was seen on 10/21/22 by Dr. Swaziland for chest heaviness when under stress and occasional sharp pain in his left arm.  He was encouraged to restart his atorvastatin 20 mg daily and to follow a carbohydrate modified diet given his prediabetes with last A1c at 6.5%.  He also underwent coronary CTA indicating a coronary calcium score of 3.43, this was 27th percentile for age, sex, race matched controls.  It was felt that his chest pain was not cardiac in nature and was recommended he continue risk factor modification.  Today he reports that he is doing well overall. He denies any further chest or arm pain. He continues working to improve his diet and exercise, he has decreased salt intake and carbohydrate conscious. Rides a bike for an hour multiple times a week. Continues to work as a Merchandiser, retail in [employer], notes his previus chest pain occurred under significant stress, reports stress with work has been improving. He has no cardiac concerns or complaints today.   ROS: Today he denies chest pain, shortness of breath, lower extremity edema, fatigue, palpitations, melena, hematuria, hemoptysis, diaphoresis, weakness, presyncope, syncope, orthopnea, and PND.   Studies Reviewed: .       Cardiac Studies  & Procedures          CT SCANS  CT CORONARY MORPH W/CTA COR W/SCORE 11/07/2022  Addendum 11/14/2022  4:48 PM ADDENDUM REPORT: 11/14/2022 16:46  EXAM: OVER-READ INTERPRETATION  CT CHEST  The following report is an over-read performed by radiologist Dr. Joen Laura [facility] on 11/14/2022. This over-read does not include interpretation of cardiac or coronary anatomy or pathology. The coronary CTA interpretation by the cardiologist is attached.  COMPARISON:  03/01/2018  FINDINGS: Mediastinum: Visualized portions of the tracheobronchial tree and esophagus are unremarkable. No lymphadenopathy.  Lungs: Multiple sub 4 mm pulmonary nodules are again noted and unchanged since 2019, consistent with benign nodules. No specific imaging follow-up is required. No acute airspace disease, effusion, or pneumothorax. Central airways are patent.  Upper abdomen: Hepatic steatosis.  No other acute findings.  Musculoskeletal: No acute or destructive bony abnormalities. Reconstructed images demonstrate no additional findings.  IMPRESSION: 1. Stable sub 4 mm bilateral pulmonary nodules, consistent with benign nodules given size and long-term stability. No specific imaging follow-up is required. 2. Hepatic steatosis.   Electronically Signed By: Sharlet Salina M.D. On: 11/14/2022 16:46  Narrative CLINICAL DATA:  68M with hyperlipidemia, family history of CAD, and chest pain.  EXAM: Cardiac/Coronary  CT  TECHNIQUE: The patient was scanned on a [employer].  FINDINGS: A 120 kV prospective scan was triggered in the descending thoracic aorta at 111 HU's. Axial non-contrast 3 mm slices were carried out through the heart. The  data set was analyzed on a dedicated work station and scored using the [employer] . Gantry rotation speed was 250 msecs and collimation was .6 mm. No beta blockade and 0.8 mg of sl NTG was given. The 3D data set was reconstructed in  5% intervals of the 67-82 % of the R-R cycle. Diastolic phases were analyzed on a dedicated work station using MPR, MIP and VRT modes. The patient received 80 cc of contrast.  Aorta: Normal size.  No calcifications.  No dissection.  Aortic Valve:  Trileaflet.  No calcifications.  Coronary Arteries:  Normal coronary origin.  Right dominance.  RCA is a large dominant artery that gives rise to PDA and PLVB. There is minimal (<25%) soft plaque.  Left main is a large artery that gives rise to LAD and LCX arteries.  LAD is a large vessel that has minimal (<25%) calcified plaque proximally. D1 has no plaque.  LCX is a non-dominant artery that gives rise to a large OM1, tiny OM2 and tiny OM3. There is no plaque.  Coronary Calcium Score:  Left main: 0  Left anterior descending artery: 2.13  Left circumflex artery: 0  Right coronary artery: 1.3  Total: 3.43  Percentile: 27th  Other findings:  Normal pulmonary vein drainage into the left atrium.  Normal let atrial appendage without a thrombus.  Normal size of the pulmonary artery.  IMPRESSION: 1. Coronary calcium score of 3.43. This was 27th percentile for age-, sex, and race-matched controls.  2. Total plaque volume 14 mm3 which is 7th percentile for age- and sex-matched controls (calcified plaque 1 mm3; non-calcified plaque 13 mm3). TPV is (mild).  3. Normal coronary origin with right dominance.  4.  Minimal plaque in the LAD and RCA.  CAD-RADS 1.  RECOMMENDATIONS: 1. CAD-RADS 0: No evidence of CAD (0%). Consider non-atherosclerotic causes of chest pain.  2. CAD-RADS 1: Minimal non-obstructive CAD (0-24%). Consider non-atherosclerotic causes of chest pain. Consider preventive therapy and risk factor modification.  3. CAD-RADS 2: Mild non-obstructive CAD (25-49%). Consider non-atherosclerotic causes of chest pain. Consider preventive therapy and risk factor modification.  4. CAD-RADS 3: Moderate stenosis.  Consider symptom-guided anti-ischemic pharmacotherapy as well as risk factor modification per guideline directed care. Additional analysis with CT FFR will be submitted.  5. CAD-RADS 4: Severe stenosis. (70-99% or > 50% left main). Cardiac catheterization or CT FFR is recommended. Consider symptom-guided anti-ischemic pharmacotherapy as well as risk factor modification per guideline directed care. Invasive coronary angiography recommended with revascularization per published guideline statements.  6. CAD-RADS 5: Total coronary occlusion (100%). Consider cardiac catheterization or viability assessment. Consider symptom-guided anti-ischemic pharmacotherapy as well as risk factor modification per guideline directed care.  7. CAD-RADS N: Non-diagnostic study. Obstructive CAD can't be excluded. Alternative evaluation is recommended.  Chilton Si, MD  Electronically Signed: By: Chilton Si M.D. On: 11/07/2022 14:12          Physical Exam:   VS:  BP 120/66   Pulse 74   Ht 5\' 5"  (1.651 m)   Wt 180 lb (81.6 kg)   SpO2 98%   BMI 29.95 kg/m    Wt Readings from Last 3 Encounters:  01/13/23 180 lb (81.6 kg)  10/21/22 184 lb 6.4 oz (83.6 kg)  11/06/17 200 lb (90.7 kg)    GEN: Well nourished, well developed in no acute distress NECK: No JVD; No carotid bruits CARDIAC: RRR, no murmurs, rubs, gallops RESPIRATORY:  Clear to auscultation without rales, wheezing or rhonchi  ABDOMEN: Soft,  non-tender, non-distended EXTREMITIES:  No edema; No deformity   ASSESSMENT AND PLAN: .    Mild nonobstructive CAD: Presented on 10/21/22 to Dr. Swaziland for chest heaviness when under stress. Underwent coronary CTA indicating a coronary calcium score of 3.43, this was 27th percentile for age, sex, race matched controls. There was minimal plaque in the LAD and RCA (<25%). Today he denies any further anginal symptoms, no further chest pain. No indication for further workup, continue with risk  factor modification. Heart healthy diet and regular cardiovascular exercise encouraged, currently biking multiple times a week.  Continue atorvastatin 20mg  daily. Start aspirin 81mg  daily.   HTN: Blood pressure well controlled today at 120/66. Continue with diet modification and low sodium diet. No indication of antihypertensives at this time.   OSA: Reports nightly CPAP compliance.   HLD: Last lipid profile on 10/21/2022 indicated total cholesterol of 92 and LDL of 44.  Continue atorvastatin 20 mg daily.  Prediabetes: Hemoglobin A1C 6.5% on 09/23/22. Monitored and managed by PCP. Per patient had recent recheck and decreased to 5.3%, unable to view these results. Continue with diet modification and exercise.        Dispo: Follow up with Dr. Swaziland or Reather Littler, NP in one year.   Signed, Rip Harbour, NP

## 2023-01-13 ENCOUNTER — Ambulatory Visit: Payer: 59 | Attending: Physician Assistant | Admitting: Cardiology

## 2023-01-13 ENCOUNTER — Encounter: Payer: Self-pay | Admitting: Cardiology

## 2023-01-13 VITALS — BP 120/66 | HR 74 | Ht 65.0 in | Wt 180.0 lb

## 2023-01-13 DIAGNOSIS — I2510 Atherosclerotic heart disease of native coronary artery without angina pectoris: Secondary | ICD-10-CM

## 2023-01-13 DIAGNOSIS — R7303 Prediabetes: Secondary | ICD-10-CM

## 2023-01-13 DIAGNOSIS — E782 Mixed hyperlipidemia: Secondary | ICD-10-CM | POA: Diagnosis not present

## 2023-01-13 DIAGNOSIS — I10 Essential (primary) hypertension: Secondary | ICD-10-CM

## 2023-01-13 DIAGNOSIS — G4733 Obstructive sleep apnea (adult) (pediatric): Secondary | ICD-10-CM

## 2023-01-13 MED ORDER — ASPIRIN 81 MG PO TBEC: 81.0000 mg | DELAYED_RELEASE_TABLET | Freq: Every day | ORAL | Status: AC

## 2023-01-13 NOTE — Patient Instructions (Signed)
Medication Instructions:  Your physician has recommended you make the following change in your medication:  START Aspirin 81 mg taking 1 daily  *If you need a refill on your cardiac medications before your next appointment, please call your pharmacy*   Lab Work: None ordered  If you have labs (blood work) drawn today and your tests are completely normal, you will receive your results only by: MyChart Message (if you have MyChart) OR A paper copy in the mail If you have any lab test that is abnormal or we need to change your treatment, we will call you to review the results.   Testing/Procedures: None ordered   Follow-Up: At [facility], you and your health needs are our priority.  As part of our continuing mission to provide you with exceptional heart care, we have created designated Provider Care Teams.  These Care Teams include your primary Cardiologist (physician) and Advanced Practice Providers (APPs -  Physician Assistants and Nurse Practitioners) who all work together to provide you with the care you need, when you need it.  We recommend signing up for the patient portal called "MyChart".  Sign up information is provided on this After Visit Summary.  MyChart is used to connect with patients for Virtual Visits (Telemedicine).  Patients are able to view lab/test results, encounter notes, upcoming appointments, etc.  Non-urgent messages can be sent to your provider as well.   To learn more about what you can do with MyChart, go to [email or web address].    Your next appointment:   12 month(s)  Provider:   Peter Swaziland, MD     Other Instructions

## 2023-01-30 ENCOUNTER — Other Ambulatory Visit (HOSPITAL_BASED_OUTPATIENT_CLINIC_OR_DEPARTMENT_OTHER): Payer: Self-pay

## 2023-01-30 DIAGNOSIS — G4730 Sleep apnea, unspecified: Secondary | ICD-10-CM

## 2024-06-04 ENCOUNTER — Ambulatory Visit
Admission: EM | Admit: 2024-06-04 | Discharge: 2024-06-04 | Disposition: A | Discharge status: Home / Self Care | Attending: Student | Admitting: Student

## 2024-06-04 DIAGNOSIS — S39012A Strain of muscle, fascia and tendon of lower back, initial encounter: Secondary | ICD-10-CM | POA: Diagnosis not present

## 2024-06-04 MED ORDER — KETOROLAC TROMETHAMINE 30 MG/ML IJ SOLN
30.0000 mg | Freq: Once | INTRAMUSCULAR | Status: AC
  Administered 2024-06-04: 30 mg via INTRAMUSCULAR

## 2024-06-04 MED ORDER — METHYLPREDNISOLONE SODIUM SUCC 125 MG IJ SOLR
60.0000 mg | Freq: Once | INTRAMUSCULAR | Status: AC
  Administered 2024-06-04: 60 mg via INTRAMUSCULAR

## 2024-06-04 MED ORDER — BACLOFEN 10 MG PO TABS: 10.0000 mg | ORAL_TABLET | Freq: Three times a day (TID) | ORAL | 0 refills | Status: AC

## 2024-06-04 NOTE — Discharge Instructions (Addendum)
-  We administered a shot of a steroid called Solumedrol today, to help with inflammation in your back - We administered a shot of Toradol  today.  This is an NSAID pain medication, similar to ibuprofen. - Because we administered the Toradol  today, please avoid NSAIDs like ibuprofen or Advil for the rest of the day. -You can still take Tylenol  1000mg  up to 3x daily - You can take the muscle relaxer Baclofen  up to 3 times daily for muscle spasms and pain.  This medication will make you drowsy, so take it when you are home and do not need to drive. -Heating pad, gentle stretching, rest

## 2024-06-04 NOTE — ED Provider Notes (Signed)
 " [facility]    CSN: [id number] Arrival date & time: 06/04/24  1456      History   Chief Complaint No chief complaint on file.   HPI Jeffrey Holden is a 63 y.o. male presenting w back and leg pain x1 day. History as below. States R lumbar paraspinous pain with radiation down the R leg. States feels like pinched nerve. States pain is worse with ambulating. States long h/o back pain. No h/o herniated disc, degeneration, etc.  Denies trauma  No change in bowel or bladder function States h/o similar back issues.   HPI  Past Medical History:  Diagnosis Date   Dyslipidemia    Family history of heart disease    History of kidney stones    Hyperlipidemia    Obesity    OSA (obstructive sleep apnea)     Patient Active Problem List   Diagnosis Date Noted   Special screening for malignant neoplasms, colon    Benign essential HTN 02/13/2014   OSA on CPAP 12/28/2013   Dyslipidemia 12/28/2013   Chest pain 12/28/2013    Past Surgical History:  Procedure Laterality Date   CARDIAC CATHETERIZATION  05/21/2006   normal coronaries, normal LV function (Dr. MICAEL Ona)   COLONOSCOPY N/A 11/06/2017   Procedure: COLONOSCOPY;  Surgeon: Harvey Margo CROME, MD;  Location: [facility];  Service: Endoscopy;  Laterality: N/A;  12:15   NM MYOCAR PERF WALL MOTION  2012   bruce myoview - normal perfusion in all regions, EF 76%   POLYPECTOMY  11/06/2017   Procedure: POLYPECTOMY;  Surgeon: Harvey Margo CROME, MD;  Location: [facility];  Service: Endoscopy;;  cecal, transverse, hepatic flexure, sigmoid, and rectal   TRANSTHORACIC ECHOCARDIOGRAM  2007   EF =>55%; mild MR; trace TR       Home Medications    Prior to Admission medications  Medication Sig Start Date End Date Taking? Authorizing Provider  baclofen  (LIORESAL ) 10 MG tablet Take 1 tablet (10 mg total) by mouth 3 (three) times daily. 06/04/24  Yes Arlyss Leita BRAVO, PA-C  aspirin  EC 81 MG tablet Take 1 tablet (81 mg  total) by mouth daily. Swallow whole. 01/13/23   West, Katlyn D, NP  atorvastatin  (LIPITOR) 20 MG tablet TAKE 1 TABLET BY MOUTH EVERY DAY 12/29/16   Hilty, Vinie BROCKS, MD  Vitamin D, Ergocalciferol, (DRISDOL) 1.25 MG (50000 UNIT) CAPS capsule Take 50,000 Units by mouth once a week. 09/27/22   [provider]    Family History Family History  Problem Relation Age of Onset   Coronary artery disease Father        CABG at 36    Social History Social History[1]   Allergies   Penicillins   Review of Systems Review of Systems  Musculoskeletal:  Positive for back pain.     Physical Exam Triage Vital Signs ED Triage Vitals  Encounter Vitals Group     BP      Girls Systolic BP Percentile      Girls Diastolic BP Percentile      Boys Systolic BP Percentile      Boys Diastolic BP Percentile      Pulse      Resp      Temp      Temp src      SpO2      Weight      Height      Head Circumference      Peak Flow  Pain Score      Pain Loc      Pain Education      Exclude from Growth Chart    No data found.  Updated Vital Signs BP (!) 177/111 (BP Location: Right Arm)   Pulse 67   Temp 98.7 F (37.1 C) (Oral)   Resp 18   SpO2 97%   Visual Acuity Right Eye Distance:   Left Eye Distance:   Bilateral Distance:    Right Eye Near:   Left Eye Near:    Bilateral Near:     Physical Exam Vitals reviewed.  Constitutional:      General: He is not in acute distress.    Appearance: Normal appearance. He is not ill-appearing.  HENT:     Head: Normocephalic and atraumatic.  Pulmonary:     Effort: Pulmonary effort is normal.  Musculoskeletal:     Comments: There is no midline spinous tenderness or deformity.  There is no paraspinous tenderness to palpation.  Right sided lumbar paraspinous discomfort is elicited with going from sitting to standing.  Gait intact.  Heel and toe walk intact.  Strength 5/5 upper and lower extremities.  Sensation intact.  Positive right  straight leg raise.  Neurological:     General: No focal deficit present.     Mental Status: He is alert and oriented to person, place, and time.  Psychiatric:        Mood and Affect: Mood normal.        Behavior: Behavior normal.        Thought Content: Thought content normal.        Judgment: Judgment normal.      UC Treatments / Results  Labs (all labs ordered are listed, but only abnormal results are displayed) Labs Reviewed - No data to display  EKG   Radiology No results found.  Procedures Procedures (including critical care time)  Medications Ordered in UC Medications  ketorolac  (TORADOL ) 30 MG/ML injection 30 mg (30 mg Intramuscular Given 06/04/24 1549)  methylPREDNISolone  sodium succinate (SOLU-MEDROL ) 125 mg/2 mL injection 60 mg (60 mg Intramuscular Given 06/04/24 1550)    Initial Impression / Assessment and Plan / UC Course  I have reviewed the triage vital signs and the nursing notes.  Pertinent labs & imaging results that were available during my care of the patient were reviewed by me and considered in my medical decision making (see chart for details).     Patient is a pleasant 63 y.o. male presenting with lumbar strain with right sided radiculopathy. The patient is afebrile and nontachycardic.Has not take any NSAIDs at home today.  No trauma, and no history of herniated disc or degeneration.  IM Toradol , and IM Solu-Medrol  administered.  He is not a diabetic, and he does not have a history of kidney disease.  Baclofen  sent.  Encouraged heating pad, gentle stretching, rest  Final Clinical Impressions(s) / UC Diagnoses   Final diagnoses:  Strain of lumbar region, initial encounter     Discharge Instructions      -We administered a shot of a steroid called Solumedrol today, to help with inflammation in your back - We administered a shot of Toradol  today.  This is an NSAID pain medication, similar to ibuprofen. - Because we administered the  Toradol  today, please avoid NSAIDs like ibuprofen or Advil for the rest of the day. -You can still take Tylenol  1000mg  up to 3x daily - You can take the muscle relaxer Baclofen  up to 3  times daily for muscle spasms and pain.  This medication will make you drowsy, so take it when you are home and do not need to drive. -Heating pad, gentle stretching, rest       ED Prescriptions     Medication Sig Dispense Auth. Provider   baclofen  (LIORESAL ) 10 MG tablet Take 1 tablet (10 mg total) by mouth 3 (three) times daily. 30 each Arlyss Leita BRAVO, PA-C      PDMP not reviewed this encounter.     [1]  Social History Tobacco Use   Smoking status: Never   Smokeless tobacco: Never  Vaping Use   Vaping status: Never Used  Substance Use Topics   Alcohol use: No   Drug use: No     Arlyss Leita BRAVO, PA-C 06/04/24 1551  "

## 2024-06-04 NOTE — ED Triage Notes (Signed)
 Pt reports he has low back pain x 2 days states right leg is going numb.  Denies injury
